# Patient Record
Sex: Male | Born: 1955 | Race: White | Hispanic: No | Marital: Married | Smoking: Current every day smoker
Health system: Southern US, Community
[De-identification: ages and names within clinical notes are randomized; demographics above are authoritative.]

## PROBLEM LIST (undated history)

## (undated) DIAGNOSIS — E7800 Pure hypercholesterolemia, unspecified: Secondary | ICD-10-CM

## (undated) DIAGNOSIS — I10 Essential (primary) hypertension: Secondary | ICD-10-CM

## (undated) DIAGNOSIS — N400 Enlarged prostate without lower urinary tract symptoms: Secondary | ICD-10-CM

## (undated) DIAGNOSIS — M1990 Unspecified osteoarthritis, unspecified site: Secondary | ICD-10-CM

## (undated) HISTORY — PX: COLONOSCOPY: SHX174

## (undated) HISTORY — PX: TONSILLECTOMY: SUR1361

## (undated) HISTORY — PX: TOE SURGERY: SHX1073

## (undated) HISTORY — PX: PILONIDAL CYST EXCISION: SHX744

---

## 2004-01-16 ENCOUNTER — Ambulatory Visit: Payer: Self-pay | Admitting: Gastroenterology

## 2006-02-07 ENCOUNTER — Emergency Department: Payer: Self-pay | Admitting: Emergency Medicine

## 2007-10-19 ENCOUNTER — Ambulatory Visit: Payer: Self-pay | Admitting: Orthopedic Surgery

## 2007-12-03 ENCOUNTER — Ambulatory Visit: Payer: Self-pay | Admitting: Orthopedic Surgery

## 2007-12-10 ENCOUNTER — Ambulatory Visit: Payer: Self-pay | Admitting: Orthopedic Surgery

## 2007-12-10 HISTORY — PX: ROTATOR CUFF REPAIR: SHX139

## 2009-05-15 ENCOUNTER — Ambulatory Visit: Payer: Self-pay | Admitting: General Surgery

## 2009-12-09 ENCOUNTER — Ambulatory Visit: Payer: Self-pay | Admitting: Orthopedic Surgery

## 2011-05-16 ENCOUNTER — Ambulatory Visit: Payer: Self-pay | Admitting: Anesthesiology

## 2011-05-16 LAB — BASIC METABOLIC PANEL
Calcium, Total: 8.9 mg/dL (ref 8.5–10.1)
Co2: 27 mmol/L (ref 21–32)
Creatinine: 1.07 mg/dL (ref 0.60–1.30)
EGFR (African American): >60
EGFR (Non-African Amer.): >60
Potassium: 3.7 mmol/L (ref 3.5–5.1)

## 2011-05-20 ENCOUNTER — Ambulatory Visit: Payer: Self-pay | Admitting: General Surgery

## 2013-04-28 ENCOUNTER — Emergency Department: Payer: Self-pay | Admitting: Emergency Medicine

## 2014-03-24 ENCOUNTER — Ambulatory Visit: Payer: Self-pay | Admitting: Gastroenterology

## 2014-05-11 NOTE — Op Note (Signed)
PATIENT NAME:  Tim Cox, Tim Cox MR#:  [id number] DATE OF BIRTH:  1955-02-05  DATE OF PROCEDURE:  05/20/2011  PREOPERATIVE DIAGNOSIS: Recurrent pilonidal cyst.   POSTOPERATIVE DIAGNOSIS: Recurrent pilonidal cyst.   OPERATIVE PROCEDURE: Excision of pilonidal cyst.   OPERATING SURGEON: Earline MayotteJeffrey W. Lynnett Langlinais, MD   ANESTHESIA: General endotracheal under Dr. Henrene HawkingKephart, Marcaine 0.5% with 1:200,000 units epinephrine, 30 mL local infiltration.   ESTIMATED BLOOD LOSS: Minimal.   CLINICAL NOTE: This 59 year old male had previously undergone a pilonidal cyst excision in the distant past. He recently fell and landed on the coccyx. He has had persistent pain and swelling in this area and examination suggested disruption of the distal end of his incision and possible recurrent pilonidal cyst formation. He is taken to the operating room for planned excision and possible rotation flap coverage.   OPERATIVE NOTE: With the patient under adequate general endotracheal anesthesia, he was rolled to the prone position and adequately padded. The area was prepped with Betadine solution and draped. Marcaine was infiltrated for postoperative analgesia. The most inferior aspect of his previous incision was less inflamed than on his last office exam. Through an elliptical incision this was excised and it was undermined approximately a centimeter and a half towards the anus. Hemostasis was excellent. There was no purulent material noted. There was some chronic scar tissue evident. The wound was irrigated with saline and then closed in multiple layers with 2-0 Vicryl figure-of-eight sutures. The skin was closed with a running 3-0 Vicryl subcuticular suture. Benzoin and Steri-Strips followed by Telfa and Tegaderm dressing was applied. The patient tolerated the procedure well and was taken to the recovery room stable condition.  ____________________________ Earline MayotteJeffrey W. Termaine Roupp, MD jwb:drc D: 05/20/2011 13:05:42 ET T: 05/20/2011  13:25:27 ET JOB#: [id number] cc: Earline MayotteJeffrey W. Elige Shouse, MD, <Dictator>, Marisue IvanKanhka Linthavong, MD Tida Saner Brion AlimentW Piper Hassebrock MD ELECTRONICALLY SIGNED 05/23/2011 9:56

## 2015-05-05 ENCOUNTER — Other Ambulatory Visit: Payer: Self-pay | Admitting: Student

## 2015-05-05 DIAGNOSIS — M7581 Other shoulder lesions, right shoulder: Secondary | ICD-10-CM

## 2015-05-26 ENCOUNTER — Ambulatory Visit
Admission: RE | Admit: 2015-05-26 | Discharge: 2015-05-26 | Disposition: A | Discharge status: Home / Self Care | Payer: 59 | Source: Ambulatory Visit | Attending: Student | Admitting: Student

## 2015-05-26 DIAGNOSIS — S46811A Strain of other muscles, fascia and tendons at shoulder and upper arm level, right arm, initial encounter: Secondary | ICD-10-CM | POA: Insufficient documentation

## 2015-05-26 DIAGNOSIS — M7581 Other shoulder lesions, right shoulder: Secondary | ICD-10-CM | POA: Insufficient documentation

## 2015-05-26 DIAGNOSIS — M25411 Effusion, right shoulder: Secondary | ICD-10-CM | POA: Diagnosis not present

## 2015-05-26 DIAGNOSIS — M19011 Primary osteoarthritis, right shoulder: Secondary | ICD-10-CM | POA: Diagnosis not present

## 2015-10-29 ENCOUNTER — Encounter: Payer: Self-pay | Admitting: *Deleted

## 2015-11-04 ENCOUNTER — Ambulatory Visit: Payer: 59 | Admitting: Anesthesiology

## 2015-11-04 ENCOUNTER — Ambulatory Visit
Admission: RE | Admit: 2015-11-04 | Discharge: 2015-11-04 | Disposition: A | Discharge status: Home / Self Care | Payer: 59 | Source: Ambulatory Visit | Attending: Surgery | Admitting: Surgery

## 2015-11-04 ENCOUNTER — Encounter
Admission: RE | Disposition: A | Discharge status: Home / Self Care | Payer: Self-pay | Source: Ambulatory Visit | Attending: Surgery

## 2015-11-04 DIAGNOSIS — N400 Enlarged prostate without lower urinary tract symptoms: Secondary | ICD-10-CM | POA: Insufficient documentation

## 2015-11-04 DIAGNOSIS — M7521 Bicipital tendinitis, right shoulder: Secondary | ICD-10-CM | POA: Diagnosis not present

## 2015-11-04 DIAGNOSIS — E785 Hyperlipidemia, unspecified: Secondary | ICD-10-CM | POA: Diagnosis not present

## 2015-11-04 DIAGNOSIS — Z7982 Long term (current) use of aspirin: Secondary | ICD-10-CM | POA: Diagnosis not present

## 2015-11-04 DIAGNOSIS — M75111 Incomplete rotator cuff tear or rupture of right shoulder, not specified as traumatic: Secondary | ICD-10-CM | POA: Insufficient documentation

## 2015-11-04 DIAGNOSIS — M19011 Primary osteoarthritis, right shoulder: Secondary | ICD-10-CM | POA: Insufficient documentation

## 2015-11-04 DIAGNOSIS — M65811 Other synovitis and tenosynovitis, right shoulder: Secondary | ICD-10-CM | POA: Insufficient documentation

## 2015-11-04 DIAGNOSIS — I10 Essential (primary) hypertension: Secondary | ICD-10-CM | POA: Diagnosis not present

## 2015-11-04 DIAGNOSIS — F17210 Nicotine dependence, cigarettes, uncomplicated: Secondary | ICD-10-CM | POA: Insufficient documentation

## 2015-11-04 DIAGNOSIS — M94211 Chondromalacia, right shoulder: Secondary | ICD-10-CM | POA: Diagnosis not present

## 2015-11-04 DIAGNOSIS — M7541 Impingement syndrome of right shoulder: Secondary | ICD-10-CM | POA: Insufficient documentation

## 2015-11-04 HISTORY — DX: Essential (primary) hypertension: I10

## 2015-11-04 HISTORY — PX: SHOULDER ARTHROSCOPY: SHX128

## 2015-11-04 HISTORY — DX: Unspecified osteoarthritis, unspecified site: M19.90

## 2015-11-04 HISTORY — PX: BICEPT TENODESIS: SHX5116

## 2015-11-04 HISTORY — DX: Pure hypercholesterolemia, unspecified: E78.00

## 2015-11-04 SURGERY — ARTHROSCOPY, SHOULDER
Anesthesia: Regional | Laterality: Right | Wound class: Clean

## 2015-11-04 MED ORDER — METOCLOPRAMIDE HCL 5 MG PO TABS
5.0000 mg | ORAL_TABLET | Freq: Three times a day (TID) | ORAL | Status: DC | PRN
Start: 2015-11-04 — End: 2015-11-04

## 2015-11-04 MED ORDER — DEXAMETHASONE SODIUM PHOSPHATE 4 MG/ML IJ SOLN
INTRAMUSCULAR | Status: DC | PRN
  Administered 2015-11-04: 4 mg via PERINEURAL
  Administered 2015-11-04: 4 mg via INTRAVENOUS

## 2015-11-04 MED ORDER — PROPOFOL 10 MG/ML IV BOLUS
INTRAVENOUS | Status: DC | PRN
  Administered 2015-11-04: 20 mg via INTRAVENOUS
  Administered 2015-11-04: 150 mg via INTRAVENOUS

## 2015-11-04 MED ORDER — BUPIVACAINE-EPINEPHRINE (PF) 0.5% -1:200000 IJ SOLN
INTRAMUSCULAR | Status: DC | PRN
  Administered 2015-11-04: 20 mL via PERINEURAL

## 2015-11-04 MED ORDER — ONDANSETRON HCL 4 MG/2ML IJ SOLN: 4.0000 mg | Freq: Four times a day (QID) | INTRAMUSCULAR | Status: DC | PRN

## 2015-11-04 MED ORDER — ROPIVACAINE HCL 5 MG/ML IJ SOLN
INTRAMUSCULAR | Status: DC | PRN
  Administered 2015-11-04: 35 mL via PERINEURAL

## 2015-11-04 MED ORDER — ACETAMINOPHEN 160 MG/5ML PO SOLN: 325.0000 mg | ORAL | Status: DC | PRN

## 2015-11-04 MED ORDER — OXYCODONE HCL 5 MG PO TABS: 5.0000 mg | ORAL_TABLET | ORAL | 0 refills | Status: DC | PRN

## 2015-11-04 MED ORDER — METOCLOPRAMIDE HCL 5 MG/ML IJ SOLN: 5.0000 mg | Freq: Three times a day (TID) | INTRAMUSCULAR | Status: DC | PRN

## 2015-11-04 MED ORDER — FENTANYL CITRATE (PF) 100 MCG/2ML IJ SOLN
INTRAMUSCULAR | Status: DC | PRN
  Administered 2015-11-04: 50 ug via INTRAVENOUS

## 2015-11-04 MED ORDER — OXYCODONE HCL 5 MG PO TABS: 5.0000 mg | ORAL_TABLET | ORAL | Status: DC | PRN

## 2015-11-04 MED ORDER — LIDOCAINE HCL (CARDIAC) 20 MG/ML IV SOLN
INTRAVENOUS | Status: DC | PRN
  Administered 2015-11-04: 40 mg via INTRATRACHEAL

## 2015-11-04 MED ORDER — LACTATED RINGERS IV SOLN
INTRAVENOUS | Status: DC
  Administered 2015-11-04 (×2): via INTRAVENOUS

## 2015-11-04 MED ORDER — GLYCOPYRROLATE 0.2 MG/ML IJ SOLN
INTRAMUSCULAR | Status: DC | PRN
  Administered 2015-11-04: 0.1 mg via INTRAVENOUS

## 2015-11-04 MED ORDER — OXYCODONE HCL 5 MG PO TABS: 5.0000 mg | ORAL_TABLET | Freq: Once | ORAL | Status: DC | PRN

## 2015-11-04 MED ORDER — MIDAZOLAM HCL 2 MG/2ML IJ SOLN
INTRAMUSCULAR | Status: DC | PRN
  Administered 2015-11-04 (×2): 2 mg via INTRAVENOUS

## 2015-11-04 MED ORDER — HYDROMORPHONE HCL 1 MG/ML IJ SOLN: 0.2500 mg | INTRAMUSCULAR | Status: DC | PRN

## 2015-11-04 MED ORDER — ONDANSETRON HCL 4 MG/2ML IJ SOLN: 4.0000 mg | Freq: Once | INTRAMUSCULAR | Status: DC | PRN

## 2015-11-04 MED ORDER — EPHEDRINE SULFATE 50 MG/ML IJ SOLN
INTRAMUSCULAR | Status: DC | PRN
  Administered 2015-11-04 (×7): 5 mg via INTRAVENOUS

## 2015-11-04 MED ORDER — OXYCODONE HCL 5 MG/5ML PO SOLN: 5.0000 mg | Freq: Once | ORAL | Status: DC | PRN

## 2015-11-04 MED ORDER — DEXTROSE 5 % IV SOLN
2000.0000 mg | Freq: Once | INTRAVENOUS | Status: AC
  Administered 2015-11-04: 2000 mg via INTRAVENOUS

## 2015-11-04 MED ORDER — ONDANSETRON HCL 4 MG/2ML IJ SOLN
INTRAMUSCULAR | Status: DC | PRN
  Administered 2015-11-04: 4 mg via INTRAVENOUS

## 2015-11-04 MED ORDER — ACETAMINOPHEN 325 MG PO TABS: 325.0000 mg | ORAL_TABLET | ORAL | Status: DC | PRN

## 2015-11-04 MED ORDER — ONDANSETRON HCL 4 MG PO TABS: 4.0000 mg | ORAL_TABLET | Freq: Four times a day (QID) | ORAL | Status: DC | PRN

## 2015-11-04 SURGICAL SUPPLY — 37 items
ANCHOR JUGGERKNOT WTAP NDL 2.9 (Anchor) ×12 IMPLANT
ANCHOR SUT QUATTRO KNTLS 4.5 (Anchor) ×8 IMPLANT
BIT DRILL JUGRKNT W/NDL BIT2.9 (DRILL) ×2 IMPLANT
BLADE FULL RADIUS 3.5 (BLADE) ×4 IMPLANT
BUR ACROMIONIZER 4.0 (BURR) ×4 IMPLANT
CANNULA SHAVER 8MMX76MM (CANNULA) ×4 IMPLANT
CHLORAPREP W/TINT 26ML (MISCELLANEOUS) ×8 IMPLANT
COVER LIGHT HANDLE UNIVERSAL (MISCELLANEOUS) ×8 IMPLANT
COVER MAYO STAND STRL (DRAPES) ×4 IMPLANT
DRAPE IMP U-DRAPE 54X76 (DRAPES) ×8 IMPLANT
DRILL JUGGERKNOT W/NDL BIT 2.9 (DRILL) ×4
GAUZE PETRO XEROFOAM 1X8 (MISCELLANEOUS) ×4 IMPLANT
GAUZE SPONGE 4X4 12PLY STRL (GAUZE/BANDAGES/DRESSINGS) ×4 IMPLANT
GLOVE BIO SURGEON STRL SZ8 (GLOVE) ×8 IMPLANT
GLOVE INDICATOR 8.0 STRL GRN (GLOVE) ×4 IMPLANT
GOWN STRL REUS W/ TWL LRG LVL3 (GOWN DISPOSABLE) ×2 IMPLANT
GOWN STRL REUS W/ TWL XL LVL3 (GOWN DISPOSABLE) ×2 IMPLANT
GOWN STRL REUS W/TWL LRG LVL3 (GOWN DISPOSABLE) ×2
GOWN STRL REUS W/TWL XL LVL3 (GOWN DISPOSABLE) ×2
IV LACTATED RINGER IRRG 3000ML (IV SOLUTION) ×4
IV LR IRRIG 3000ML ARTHROMATIC (IV SOLUTION) ×4 IMPLANT
MANIFOLD 4PT FOR NEPTUNE1 (MISCELLANEOUS) ×4 IMPLANT
MAT BLUE FLOOR 46X72 FLO (MISCELLANEOUS) ×4 IMPLANT
NEEDLE HYPO 21X1.5 SAFETY (NEEDLE) ×4 IMPLANT
NEEDLE REVERSE CUT 1/2 CRC (NEEDLE) IMPLANT
PACK ARTHROSCOPY SHOULDER (MISCELLANEOUS) ×4 IMPLANT
PAD GROUND ADULT SPLIT (MISCELLANEOUS) ×4 IMPLANT
STAPLER SKIN PROX 35W (STAPLE) ×4 IMPLANT
STRAP BODY AND KNEE 60X3 (MISCELLANEOUS) ×8 IMPLANT
SUT ETHIBOND 0 MO6 C/R (SUTURE) ×4 IMPLANT
SUT VIC AB 2-0 CT1 27 (SUTURE)
SUT VIC AB 2-0 CT1 TAPERPNT 27 (SUTURE) IMPLANT
TAPE MICROFOAM 4IN (TAPE) ×4 IMPLANT
TUBING ARTHRO INFLOW-ONLY STRL (TUBING) ×4 IMPLANT
TUBING CONNECTING 10 (TUBING) ×3 IMPLANT
TUBING CONNECTING 10' (TUBING) ×1
WAND HAND CNTRL MULTIVAC 90 (MISCELLANEOUS) ×4 IMPLANT

## 2015-11-04 NOTE — Anesthesia Procedure Notes (Signed)
Anesthesia Regional Block:  Interscalene brachial plexus block  Pre-Anesthetic Checklist: ,, timeout performed, Correct Patient, Correct Site, Correct Laterality, Correct Procedure, Correct Position, site marked, Risks and benefits discussed,  Surgical consent,  Pre-op evaluation,  At surgeon's request and post-op pain management   Prep: chloraprep       Needles:  Injection technique: Single-shot  Needle Type: Stimiplex     Needle Length: 10cm 10 cm Needle Gauge: 21 and 21 G    Additional Needles:  Procedures: ultrasound guided (picture in chart) Interscalene brachial plexus block Narrative:  Start time: 11/04/2015 12:46 PM End time: 11/04/2015 12:55 PM Injection made incrementally with aspirations every 5 mL.  Performed by: Personally  Anesthesiologist: Starling Jessie  Additional Notes: Functioning IV was confirmed and monitors applied. Ultrasound guidance: relevant anatomy identified, needle position confirmed, local anesthetic spread visualized around nerve(s)., vascular puncture avoided.  Image printed for medical record.  Negative aspiration and no paresthesias; incremental administration of local anesthetic. The patient tolerated the procedure well. Vitals signes recorded in RN notes.

## 2015-11-04 NOTE — Op Note (Signed)
11/04/2015  3:53 PM  Patient:   Tim Tetterton Edmonston Jr.  Pre-Op Diagnosis:   Impingement/tendinopathy with near full-thickness rotator cuff tear, right shoulder.  Postoperative diagnosis: Impingement/tendinopathy with near full-thickness rotator cuff tear, extensive degenerative labral fraying, rotator degenerative joint disease, and biceps tendinopathy, right shoulder.  Procedure: Extensive arthroscopic debridement, arthroscopic subacromial decompression, mini-open rotator cuff repair, and mini-open biceps tenodesis, right shoulder.  Anesthesia: General LMA with interscalene block placed preoperatively by the anesthesiologist.  Surgeon:   Maryagnes Amos, MD  Assistant:   None  Findings: As above. 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 There was a near full-thickness tear involving the mid insertional fibers of the supraspinatus measuring approximately 2 cm in the anteroposterior dimension by 1 cm. There also were moderate tendinopathic changes of the biceps tendon.  Complications: None  Fluids:   1300 cc  Estimated blood loss: 5 cc  Tourniquet time: None  Drains: None  Closure: Staples   Brief clinical note: The patient isand a 60 year old male with a history of right shoulder pain. The patient's symptoms have progressed despite medications, activity modification, etc. The patient's history and examination are consistent with impingement/tendinopathy with a probable rotator cuff tear. These findings were confirmed by MRI scan. The patient presents at this time for definitive management of these shoulder symptoms.  Procedure: The patient underwent placement of an interscalene block by the anesthesiologist in the preoperative holding area before he was brought into the operating room and Tim in the supine  position. The patient then underwent gelaryngeal mask anesthesia before being repositioned in the beach chair position using the beach chair positioner. ThWright shoulder and upper extremity were prepped with ChloraPrep solution before being draped sterilely. Preoperative antibiotics were administered. A timeout was performed to confirm the proper surgical site before the expected portal sites and incision site were injected with 0.5% Sensorcaine with epinephrine. A posterior portal was created and the glenohumeral joint thoroughly inspected with the findings as described above. An anterior portal was created using an outside-in technique. The labrum and rotator cuff were further probed, again confirming the above-noted findings. The areas of extensive labral fraying were debrided back to stable margins using the full radius resector. The labrum itself appeared to be intact and had remained attached to the superior portion of the glenoid. Areas of synovitis, as well as areas of loose articular cartilage also were debrided back to stable margins using a full-radius resector. The ArthroCare wand was inserted and used to release the biceps tendon from its labral attachment. In addition, it was used to obtain hemostasis as well as to "anneal" the labrum superiorly and anteriorly. The instruments were removed from the joint after suctioning the excess fluid.  The camera was repositioned through the posterior portal into the subacromial space. A separate lateral portal was created using an outside-in technique. The 3.5 mm full-radius resector was introduced and used to perform a subtotal bursectomy. The ArthroCare wand was then inserted and used to remove the periosteal tissue off the undersurface of the anterior third of the acromion as well as to recess the coracoacromial ligament from its attachment along the anterior and lateral margins of the acromion. The 4.0 mm acromionizing bur was introduced and used to  complete the decompression by removing the undersurface of the anterior third of the acromion. The full radius resector was reintroduced to remove any residual  bony debris before the ArthroCare wand was reintroduced to obtain hemostasis. The instruments were then removed from the subacromial space after suctioning the excess fluid.  An approximately 4-5 cm incision was made over the anterolateral aspect of the shoulder beginning at the anterolateral corner of the acromion and extending distally in line with the bicipital groove. This incision was carried down through the subcutaneous tissues to expose the deltoid fascia. The raphae between the anterior and middle thirds was identified and this plane developed to provide access into the subacromial space. Additional bursal tissues were debrided sharply using Metzenbaum scissors. The rotator cuff tear was readily identified. The margins were debrided sharply with a #15 blade and the exposed greater tuberosity roughened with a rongeur. The tear was repaired using two Biomet 2.9 mm JuggerKnot anchors. These sutures were then brought back laterally and secured using two [place] QuatroLink anchors to create a two-layer closure. An apparent watertight closure was obtained.  The bicipital groove was identified by palpation and opened for 1-1.5 cm. The biceps tendon stump was retrieved through this defect. The floor of the bicipital groove was roughened with a curet before another Biomet 2.9 mm JuggerKnot anchor was inserted. Both sets of sutures were passed through the biceps tendon and tied securely to effect the tenodesis. The bicipital sheath was reapproximated using two #0 Ethibond interrupted sutures, incorporating the biceps tendon to further reinforce the tenodesis.  The wound was copiously irrigated with sterile saline solution before the deltoid raphae was reapproximated using 2-0 Vicryl interrupted sutures. The subcutaneous tissues were closed in two layers  using 2-0 Vicryl interrupted sutures before the skin was closed using staples. The portal sites also were closed using staples. A sterile bulky dressing was applied to the shoulder before the arm was placed into a shoulder immobilizer. The patient was then awakened, extubated, and returned to the recovery room in satisfactory condition after tolerating the procedure well.

## 2015-11-04 NOTE — Anesthesia Preprocedure Evaluation (Addendum)
Anesthesia Evaluation  Patient identified by MRN, date of birth, ID band Patient awake    Reviewed: Allergy & Precautions, H&P , NPO status , Patient's Chart, lab work & pertinent test results, reviewed documented beta blocker date and time   Airway Mallampati: II  TM Distance: >3 FB Neck ROM: full    Dental no notable dental hx.    Pulmonary Current Smoker,    Pulmonary exam normal breath sounds clear to auscultation       Cardiovascular Exercise Tolerance: Good hypertension,  Rhythm:regular Rate:Normal     Neuro/Psych negative neurological ROS  negative psych ROS   GI/Hepatic negative GI ROS, Neg liver ROS,   Endo/Other  negative endocrine ROS  Renal/GU negative Renal ROS  negative genitourinary   Musculoskeletal   Abdominal   Peds  Hematology negative hematology ROS (+)   Anesthesia Other Findings   Reproductive/Obstetrics negative OB ROS                             Anesthesia Physical Anesthesia Plan  ASA: II  Anesthesia Plan: General LMA and Regional   Post-op Pain Management:  Regional for Post-op pain   Induction:   Airway Management Planned:   Additional Equipment:   Intra-op Plan:   Post-operative Plan:   Informed Consent: I have reviewed the patients History and Physical, chart, labs and discussed the procedure including the risks, benefits and alternatives for the proposed anesthesia with the patient or authorized representative who has indicated his/her understanding and acceptance.   Dental Advisory Given  Plan Discussed with: CRNA and Anesthesiologist  Anesthesia Plan Comments:         Anesthesia Quick Evaluation

## 2015-11-04 NOTE — Transfer of Care (Signed)
Immediate Anesthesia Transfer of Care Note  Patient: Tim SaugerWilliam Kenneth Verdone Jr.  Procedure(s) Performed: Procedure(s): ARTHROSCOPY SHOULDER WITH DEBRIDEMENT DECOMPRESSION AND REPAIR OF THE ROTATOR CUFF TEAR (Right)  Patient Location: PACU  Anesthesia Type: General LMA, Regional  Level of Consciousness: awake, alert  and patient cooperative  Airway and Oxygen Therapy: Patient Spontanous Breathing and Patient connected to supplemental oxygen  Post-op Assessment: Post-op Vital signs reviewed, Patient's Cardiovascular Status Stable, Respiratory Function Stable, Patent Airway and No signs of Nausea or vomiting  Post-op Vital Signs: Reviewed and stable  Complications: No apparent anesthesia complications

## 2015-11-04 NOTE — Progress Notes (Signed)
Assisted Baxter Flatteryavid Bacon ANMD with right, ultrasound guided, supraclavicular block. Side rails up, monitors on throughout procedure. See vital signs in flow sheet. Tolerated Procedure well.

## 2015-11-04 NOTE — Discharge Instructions (Signed)
General Anesthesia, Adult, Care After Refer to this sheet in the next few weeks. These instructions provide you with information on caring for yourself after your procedure. Your health care provider may also give you more specific instructions. Your treatment has been planned according to current medical practices, but problems sometimes occur. Call your health care provider if you have any problems or questions after your procedure. WHAT TO EXPECT AFTER THE PROCEDURE After the procedure, it is typical to experience:  Sleepiness.  Nausea and vomiting. HOME CARE INSTRUCTIONS  For the first 24 hours after general anesthesia:  Have a responsible person with you.  Do not drive a car. If you are alone, do not take public transportation.  Do not drink alcohol.  Do not take medicine that has not been prescribed by your health care provider.  Do not sign important papers or make important decisions.  You may resume a normal diet and activities as directed by your health care provider.  Change bandages (dressings) as directed.  If you have questions or problems that seem related to general anesthesia, call the hospital and ask for the anesthetist or anesthesiologist on call. SEEK MEDICAL CARE IF:  You have nausea and vomiting that continue the day after anesthesia.  You develop a rash. SEEK IMMEDIATE MEDICAL CARE IF:   You have difficulty breathing.  You have chest pain.  You have any allergic problems.   This information is not intended to replace advice given to you by your health care provider. Make sure you discuss any questions you have with your health care provider.   Document Released: 04/11/2000 Document Revised: 01/24/2014 Document Reviewed: 05/04/2011 Elsevier Interactive Patient Education 2016 [employer]  Keep dressing dry and intact.  May shower after dressing changed on post-op day #4 (Sunday).  Cover staples with Band-Aids after drying off. Apply ice  frequently to shoulder. Take Aleve 2 tablets twice daily with meals for 7-10 days, then as necessary. Take oxycodone as prescribed when needed.  May supplement with ES Tylenol if necessary. Keep shoulder immobilizer on at all times except may remove for bathing purposes. Follow-up in 10-14 days or as scheduled.

## 2015-11-04 NOTE — H&P (Signed)
Paper H&P to be scanned into permanent record. H&P reviewed. No changes. 

## 2015-11-04 NOTE — Anesthesia Procedure Notes (Signed)
Procedure Name: LMA Insertion Date/Time: 11/04/2015 2:18 PM Performed by: Jimmy PicketAMYOT, Sequoyah Counterman Pre-anesthesia Checklist: Patient identified, Emergency Drugs available, Suction available, Timeout performed and Patient being monitored Patient Re-evaluated:Patient Re-evaluated prior to inductionOxygen Delivery Method: Circle system utilized Preoxygenation: Pre-oxygenation with 100% oxygen Intubation Type: IV induction LMA: LMA inserted LMA Size: 4.0 Number of attempts: 1 Placement Confirmation: positive ETCO2 and breath sounds checked- equal and bilateral Tube secured with: Tape

## 2015-11-04 NOTE — Anesthesia Postprocedure Evaluation (Signed)
Anesthesia Post Note  Patient: Tim SaugerWilliam Kenneth Latouche Jr.  Procedure(s) Performed: Procedure(s) (LRB): ARTHROSCOPY SHOULDER WITH DEBRIDEMENT DECOMPRESSION AND REPAIR OF THE ROTATOR CUFF TEAR (Right) BICEPS TENODESIS  Patient location during evaluation: PACU Anesthesia Type: General Level of consciousness: awake and alert Pain management: pain level controlled Vital Signs Assessment: post-procedure vital signs reviewed and stable Respiratory status: spontaneous breathing, nonlabored ventilation, respiratory function stable and patient connected to nasal cannula oxygen Cardiovascular status: blood pressure returned to baseline and stable Postop Assessment: no signs of nausea or vomiting Anesthetic complications: no    Alta CorningBacon, Waleed Dettman S

## 2015-11-05 ENCOUNTER — Encounter: Payer: Self-pay | Admitting: Surgery

## 2020-03-16 ENCOUNTER — Other Ambulatory Visit: Payer: Self-pay | Admitting: Surgery

## 2020-03-16 DIAGNOSIS — M19012 Primary osteoarthritis, left shoulder: Secondary | ICD-10-CM

## 2020-03-16 DIAGNOSIS — M7582 Other shoulder lesions, left shoulder: Secondary | ICD-10-CM

## 2020-03-16 DIAGNOSIS — M25512 Pain in left shoulder: Secondary | ICD-10-CM

## 2020-03-16 DIAGNOSIS — M75122 Complete rotator cuff tear or rupture of left shoulder, not specified as traumatic: Secondary | ICD-10-CM

## 2020-03-27 ENCOUNTER — Ambulatory Visit
Admission: RE | Admit: 2020-03-27 | Discharge: 2020-03-27 | Disposition: A | Discharge status: Home / Self Care | Payer: BC Managed Care – PPO | Source: Ambulatory Visit | Attending: Surgery | Admitting: Surgery

## 2020-03-27 ENCOUNTER — Other Ambulatory Visit: Payer: Self-pay

## 2020-03-27 DIAGNOSIS — M75122 Complete rotator cuff tear or rupture of left shoulder, not specified as traumatic: Secondary | ICD-10-CM | POA: Insufficient documentation

## 2020-03-27 DIAGNOSIS — M25512 Pain in left shoulder: Secondary | ICD-10-CM | POA: Insufficient documentation

## 2020-03-27 DIAGNOSIS — M7582 Other shoulder lesions, left shoulder: Secondary | ICD-10-CM | POA: Insufficient documentation

## 2020-03-27 DIAGNOSIS — M19012 Primary osteoarthritis, left shoulder: Secondary | ICD-10-CM | POA: Insufficient documentation

## 2020-04-30 ENCOUNTER — Other Ambulatory Visit: Payer: Self-pay | Admitting: Surgery

## 2020-05-14 ENCOUNTER — Other Ambulatory Visit
Admission: RE | Admit: 2020-05-14 | Discharge: 2020-05-14 | Disposition: A | Discharge status: Home / Self Care | Payer: BC Managed Care – PPO | Source: Ambulatory Visit | Attending: Surgery | Admitting: Surgery

## 2020-05-14 ENCOUNTER — Other Ambulatory Visit: Payer: Self-pay

## 2020-05-14 ENCOUNTER — Encounter
Admission: RE | Admit: 2020-05-14 | Discharge: 2020-05-14 | Disposition: A | Discharge status: Home / Self Care | Payer: BC Managed Care – PPO | Source: Ambulatory Visit | Attending: Surgery | Admitting: Surgery

## 2020-05-14 DIAGNOSIS — I10 Essential (primary) hypertension: Secondary | ICD-10-CM | POA: Diagnosis not present

## 2020-05-14 DIAGNOSIS — Z01818 Encounter for other preprocedural examination: Secondary | ICD-10-CM | POA: Diagnosis not present

## 2020-05-14 HISTORY — DX: Benign prostatic hyperplasia without lower urinary tract symptoms: N40.0

## 2020-05-14 LAB — POTASSIUM: Potassium: 4.1 mmol/L (ref 3.5–5.1)

## 2020-05-14 NOTE — Patient Instructions (Addendum)
Your procedure is scheduled on:  Thursday, May 5 Report to the Registration Desk on the 1st floor of the [facility] To find out your arrival time, please call [phone] between 1PM - 3PM on: Wednesday, May 4  REMEMBER: Instructions that are not followed completely may result in serious medical risk, up to and including death; or upon the discretion of your surgeon and anesthesiologist your surgery may need to be rescheduled.  Do not eat food after midnight the night before surgery.  No gum chewing, lozengers or hard candies.  You may however, drink CLEAR liquids up to 2 hours before you are scheduled to arrive for your surgery. Do not drink anything within 2 hours of your scheduled arrival time.  Clear liquids include: - water  - apple juice without pulp - gatorade (not RED, PURPLE, OR BLUE) - black coffee or tea (Do NOT add milk or creamers to the coffee or tea) Do NOT drink anything that is not on this list.  In addition, your doctor has ordered for you to drink the provided  Ensure Pre-Surgery Clear Carbohydrate Drink  Drinking this carbohydrate drink up to two hours before surgery helps to reduce insulin resistance and improve patient outcomes. Please complete drinking 2 hours prior to scheduled arrival time.  TAKE THESE MEDICATIONS THE MORNING OF SURGERY WITH A SIP OF WATER:  1.  tamsulosin (Flomax)  One week prior to surgery: starting today, April 28 Stop Anti-inflammatories (NSAIDS) such as Advil, Aleve, Ibuprofen, Motrin, Naproxen, Naprosyn and Aspirin based products such as Excedrin, Goodys Powder, BC Powder. Stop ANY OVER THE COUNTER supplements until after surgery.  No Alcohol for 24 hours before or after surgery.  No Smoking including e-cigarettes for 24 hours prior to surgery.  No chewable tobacco products for at least 6 hours prior to surgery.  No nicotine patches on the day of surgery.  Do not use any "recreational" drugs for at least a week prior to your  surgery.  Please be advised that the combination of cocaine and anesthesia may have negative outcomes, up to and including death. If you test positive for cocaine, your surgery will be cancelled.  On the morning of surgery brush your teeth with toothpaste and water, you may rinse your mouth with mouthwash if you wish. Do not swallow any toothpaste or mouthwash.  Do not wear jewelry, make-up, hairpins, clips or nail polish.  Do not wear lotions, powders, or perfumes.   Do not shave body from the neck down 48 hours prior to surgery just in case you cut yourself which could leave a site for infection.  Also, freshly shaved skin may become irritated if using the CHG soap.  Contact lenses, hearing aids and dentures may not be worn into surgery.  Do not bring valuables to the hospital. [facility] is not responsible for any missing/lost belongings or valuables.   Use CHG Soap as directed on instruction sheet.  Notify your doctor if there is any change in your medical condition (cold, fever, infection).  Wear comfortable clothing (specific to your surgery type) to the hospital.  Plan for stool softeners for home use; pain medications have a tendency to cause constipation. You can also help prevent constipation by eating foods high in fiber such as fruits and vegetables and drinking plenty of fluids as your diet allows.  After surgery, you can help prevent lung complications by doing breathing exercises.  Take deep breaths and cough every 1-2 hours. Your doctor may order a device  called an Facilities manager to help you take deep breaths.  If you are being discharged the day of surgery, you will not be allowed to drive home. You will need a responsible adult (18 years or older) to drive you home and stay with you that night.   If you are taking public transportation, you will need to have a responsible adult (18 years or older) with you. Please confirm with your physician that it is  acceptable to use public transportation.   Please call the Pre-admissions Testing Dept. at [phone] if you have any questions about these instructions.  Surgery Visitation Policy:  Patients undergoing a surgery or procedure may have one family member or support person with them as long as that person is not COVID-19 positive or experiencing its symptoms.  That person may remain in the waiting area during the procedure.

## 2020-05-21 ENCOUNTER — Encounter
Admission: RE | Disposition: A | Discharge status: Home / Self Care | Payer: Self-pay | Source: Home / Self Care | Attending: Surgery

## 2020-05-21 ENCOUNTER — Encounter: Payer: Self-pay | Admitting: Surgery

## 2020-05-21 ENCOUNTER — Other Ambulatory Visit: Payer: Self-pay

## 2020-05-21 ENCOUNTER — Ambulatory Visit: Payer: BC Managed Care – PPO

## 2020-05-21 ENCOUNTER — Ambulatory Visit
Admission: RE | Admit: 2020-05-21 | Discharge: 2020-05-21 | Disposition: A | Discharge status: Home / Self Care | Payer: BC Managed Care – PPO | Attending: Surgery | Admitting: Surgery

## 2020-05-21 DIAGNOSIS — M7522 Bicipital tendinitis, left shoulder: Secondary | ICD-10-CM | POA: Diagnosis not present

## 2020-05-21 DIAGNOSIS — M19012 Primary osteoarthritis, left shoulder: Secondary | ICD-10-CM | POA: Diagnosis not present

## 2020-05-21 DIAGNOSIS — Z7982 Long term (current) use of aspirin: Secondary | ICD-10-CM | POA: Diagnosis not present

## 2020-05-21 DIAGNOSIS — M25812 Other specified joint disorders, left shoulder: Secondary | ICD-10-CM | POA: Diagnosis present

## 2020-05-21 DIAGNOSIS — F17210 Nicotine dependence, cigarettes, uncomplicated: Secondary | ICD-10-CM | POA: Diagnosis not present

## 2020-05-21 DIAGNOSIS — S43432A Superior glenoid labrum lesion of left shoulder, initial encounter: Secondary | ICD-10-CM | POA: Diagnosis not present

## 2020-05-21 DIAGNOSIS — Z79899 Other long term (current) drug therapy: Secondary | ICD-10-CM | POA: Insufficient documentation

## 2020-05-21 DIAGNOSIS — X58XXXA Exposure to other specified factors, initial encounter: Secondary | ICD-10-CM | POA: Insufficient documentation

## 2020-05-21 DIAGNOSIS — Z419 Encounter for procedure for purposes other than remedying health state, unspecified: Secondary | ICD-10-CM

## 2020-05-21 DIAGNOSIS — Z9889 Other specified postprocedural states: Secondary | ICD-10-CM | POA: Insufficient documentation

## 2020-05-21 DIAGNOSIS — M75112 Incomplete rotator cuff tear or rupture of left shoulder, not specified as traumatic: Secondary | ICD-10-CM | POA: Insufficient documentation

## 2020-05-21 HISTORY — PX: SHOULDER ARTHROSCOPY: SHX128

## 2020-05-21 SURGERY — ARTHROSCOPY, SHOULDER
Anesthesia: General | Site: Shoulder | Laterality: Left

## 2020-05-21 MED ORDER — METOCLOPRAMIDE HCL 10 MG PO TABS: 5.0000 mg | ORAL_TABLET | Freq: Three times a day (TID) | ORAL | Status: DC | PRN

## 2020-05-21 MED ORDER — EPINEPHRINE PF 1 MG/ML IJ SOLN
INTRAMUSCULAR | Status: DC | PRN
  Administered 2020-05-21: 1 mg

## 2020-05-21 MED ORDER — METOCLOPRAMIDE HCL 5 MG/ML IJ SOLN: 5.0000 mg | Freq: Three times a day (TID) | INTRAMUSCULAR | Status: DC | PRN

## 2020-05-21 MED ORDER — DEXAMETHASONE SODIUM PHOSPHATE 10 MG/ML IJ SOLN
INTRAMUSCULAR | Status: AC
  Filled 2020-05-21: qty 1

## 2020-05-21 MED ORDER — KETOROLAC TROMETHAMINE 30 MG/ML IJ SOLN: 30.0000 mg | Freq: Once | INTRAMUSCULAR | Status: DC

## 2020-05-21 MED ORDER — ACETAMINOPHEN 10 MG/ML IV SOLN
INTRAVENOUS | Status: AC
  Filled 2020-05-21: qty 100

## 2020-05-21 MED ORDER — CEFAZOLIN SODIUM-DEXTROSE 2-4 GM/100ML-% IV SOLN
2.0000 g | INTRAVENOUS | Status: AC
  Administered 2020-05-21: 2 g via INTRAVENOUS

## 2020-05-21 MED ORDER — BUPIVACAINE-EPINEPHRINE (PF) 0.5% -1:200000 IJ SOLN
INTRAMUSCULAR | Status: DC | PRN
  Administered 2020-05-21: 30 mL

## 2020-05-21 MED ORDER — FENTANYL CITRATE (PF) 100 MCG/2ML IJ SOLN
INTRAMUSCULAR | Status: DC | PRN
  Administered 2020-05-21: 50 ug via INTRAVENOUS

## 2020-05-21 MED ORDER — ONDANSETRON HCL 4 MG/2ML IJ SOLN: 4.0000 mg | Freq: Four times a day (QID) | INTRAMUSCULAR | Status: DC | PRN

## 2020-05-21 MED ORDER — MIDAZOLAM HCL 2 MG/2ML IJ SOLN: 1.0000 mg | Freq: Once | INTRAMUSCULAR | Status: AC

## 2020-05-21 MED ORDER — CEFAZOLIN SODIUM-DEXTROSE 2-4 GM/100ML-% IV SOLN
INTRAVENOUS | Status: AC
  Filled 2020-05-21: qty 100

## 2020-05-21 MED ORDER — OXYCODONE HCL 5 MG PO TABS: 5.0000 mg | ORAL_TABLET | ORAL | Status: DC | PRN

## 2020-05-21 MED ORDER — FENTANYL CITRATE (PF) 100 MCG/2ML IJ SOLN
INTRAMUSCULAR | Status: AC
  Filled 2020-05-21: qty 2

## 2020-05-21 MED ORDER — FAMOTIDINE 20 MG PO TABS
ORAL_TABLET | ORAL | Status: AC
  Administered 2020-05-21: 20 mg
  Filled 2020-05-21: qty 1

## 2020-05-21 MED ORDER — GLYCOPYRROLATE 0.2 MG/ML IJ SOLN
INTRAMUSCULAR | Status: AC
  Filled 2020-05-21: qty 1

## 2020-05-21 MED ORDER — MIDAZOLAM HCL 2 MG/2ML IJ SOLN
INTRAMUSCULAR | Status: DC | PRN
  Administered 2020-05-21: 2 mg via INTRAVENOUS

## 2020-05-21 MED ORDER — GLYCOPYRROLATE 0.2 MG/ML IJ SOLN
INTRAMUSCULAR | Status: DC | PRN
  Administered 2020-05-21: .2 mg via INTRAVENOUS

## 2020-05-21 MED ORDER — MIDAZOLAM HCL 2 MG/2ML IJ SOLN
INTRAMUSCULAR | Status: AC
  Filled 2020-05-21: qty 2

## 2020-05-21 MED ORDER — ONDANSETRON HCL 4 MG/2ML IJ SOLN
INTRAMUSCULAR | Status: DC | PRN
  Administered 2020-05-21: 4 mg via INTRAVENOUS

## 2020-05-21 MED ORDER — LACTATED RINGERS IV SOLN: INTRAVENOUS | Status: DC

## 2020-05-21 MED ORDER — ACETAMINOPHEN 10 MG/ML IV SOLN: 1000.0000 mg | Freq: Once | INTRAVENOUS | Status: DC | PRN

## 2020-05-21 MED ORDER — SUGAMMADEX SODIUM 200 MG/2ML IV SOLN
INTRAVENOUS | Status: DC | PRN
  Administered 2020-05-21: 200 mg via INTRAVENOUS

## 2020-05-21 MED ORDER — SODIUM CHLORIDE 0.9 % IV SOLN: INTRAVENOUS | Status: DC

## 2020-05-21 MED ORDER — OXYCODONE HCL 5 MG/5ML PO SOLN: 5.0000 mg | Freq: Once | ORAL | Status: DC | PRN

## 2020-05-21 MED ORDER — ONDANSETRON HCL 4 MG PO TABS: 4.0000 mg | ORAL_TABLET | Freq: Four times a day (QID) | ORAL | Status: DC | PRN

## 2020-05-21 MED ORDER — PROPOFOL 10 MG/ML IV BOLUS
INTRAVENOUS | Status: DC | PRN
  Administered 2020-05-21: 150 mg via INTRAVENOUS

## 2020-05-21 MED ORDER — OXYCODONE HCL 5 MG PO TABS
5.0000 mg | ORAL_TABLET | Freq: Once | ORAL | Status: DC | PRN
Start: 2020-05-21 — End: 2020-05-21

## 2020-05-21 MED ORDER — ORAL CARE MOUTH RINSE: 15.0000 mL | Freq: Once | OROMUCOSAL | Status: AC

## 2020-05-21 MED ORDER — PROPOFOL 10 MG/ML IV BOLUS
INTRAVENOUS | Status: AC
  Filled 2020-05-21: qty 20

## 2020-05-21 MED ORDER — LIDOCAINE HCL (CARDIAC) PF 100 MG/5ML IV SOSY
PREFILLED_SYRINGE | INTRAVENOUS | Status: DC | PRN
  Administered 2020-05-21: 60 mg via INTRAVENOUS

## 2020-05-21 MED ORDER — ROCURONIUM BROMIDE 100 MG/10ML IV SOLN
INTRAVENOUS | Status: DC | PRN
  Administered 2020-05-21: 50 mg via INTRAVENOUS

## 2020-05-21 MED ORDER — MIDAZOLAM HCL 2 MG/2ML IJ SOLN
INTRAMUSCULAR | Status: AC
  Administered 2020-05-21: 1 mg via INTRAVENOUS
  Filled 2020-05-21: qty 2

## 2020-05-21 MED ORDER — KETOROLAC TROMETHAMINE 30 MG/ML IJ SOLN
INTRAMUSCULAR | Status: DC | PRN
  Administered 2020-05-21: 30 mg via INTRAVENOUS

## 2020-05-21 MED ORDER — CHLORHEXIDINE GLUCONATE 0.12 % MT SOLN
15.0000 mL | Freq: Once | OROMUCOSAL | Status: AC
  Administered 2020-05-21: 15 mL via OROMUCOSAL

## 2020-05-21 MED ORDER — BUPIVACAINE LIPOSOME 1.3 % IJ SUSP
INTRAMUSCULAR | Status: AC
  Filled 2020-05-21: qty 20

## 2020-05-21 MED ORDER — BUPIVACAINE HCL (PF) 0.5 % IJ SOLN
INTRAMUSCULAR | Status: DC | PRN
  Administered 2020-05-21: 10 mL

## 2020-05-21 MED ORDER — ONDANSETRON HCL 4 MG/2ML IJ SOLN: 4.0000 mg | Freq: Once | INTRAMUSCULAR | Status: DC | PRN

## 2020-05-21 MED ORDER — BUPIVACAINE LIPOSOME 1.3 % IJ SUSP
INTRAMUSCULAR | Status: DC | PRN
  Administered 2020-05-21: 10 mL

## 2020-05-21 MED ORDER — FENTANYL CITRATE (PF) 100 MCG/2ML IJ SOLN: 50.0000 ug | Freq: Once | INTRAMUSCULAR | Status: AC

## 2020-05-21 MED ORDER — DEXAMETHASONE SODIUM PHOSPHATE 10 MG/ML IJ SOLN
INTRAMUSCULAR | Status: DC | PRN
  Administered 2020-05-21: 10 mg via INTRAVENOUS

## 2020-05-21 MED ORDER — PHENYLEPHRINE HCL-NACL 10-0.9 MG/250ML-% IV SOLN
INTRAVENOUS | Status: DC | PRN
  Administered 2020-05-21: 40 ug/min via INTRAVENOUS

## 2020-05-21 MED ORDER — OXYCODONE HCL 5 MG PO TABS: 5.0000 mg | ORAL_TABLET | ORAL | 0 refills | Status: DC | PRN

## 2020-05-21 MED ORDER — EPHEDRINE SULFATE 50 MG/ML IJ SOLN
INTRAMUSCULAR | Status: DC | PRN
  Administered 2020-05-21 (×2): 10 mg via INTRAVENOUS

## 2020-05-21 MED ORDER — ONDANSETRON HCL 4 MG/2ML IJ SOLN
INTRAMUSCULAR | Status: AC
  Filled 2020-05-21: qty 2

## 2020-05-21 MED ORDER — CHLORHEXIDINE GLUCONATE 0.12 % MT SOLN
OROMUCOSAL | Status: AC
  Filled 2020-05-21: qty 15

## 2020-05-21 MED ORDER — FENTANYL CITRATE (PF) 100 MCG/2ML IJ SOLN
INTRAMUSCULAR | Status: AC
  Administered 2020-05-21: 50 ug via INTRAVENOUS
  Filled 2020-05-21: qty 2

## 2020-05-21 MED ORDER — FENTANYL CITRATE (PF) 100 MCG/2ML IJ SOLN: 25.0000 ug | INTRAMUSCULAR | Status: DC | PRN

## 2020-05-21 MED ORDER — ACETAMINOPHEN 10 MG/ML IV SOLN
INTRAVENOUS | Status: DC | PRN
  Administered 2020-05-21: 1000 mg via INTRAVENOUS

## 2020-05-21 SURGICAL SUPPLY — 52 items
ANCH SUT 2 2.9 2 LD TPR NDL (Anchor) ×1 IMPLANT
ANCH SUT 2 2/0 ABS BRD STRL (Anchor) ×1 IMPLANT
ANCH SUT BN ASCP DLV (Anchor) ×1 IMPLANT
ANCH SUT RGNRT REGENETEN (Staple) ×1 IMPLANT
ANCHOR BONE REGENETEN (Anchor) ×2 IMPLANT
ANCHOR JUGGERKNOT WTAP NDL 2.9 (Anchor) ×2 IMPLANT
ANCHOR SUT W/ ORTHOCORD (Anchor) ×2 IMPLANT
ANCHOR TENDON REGENETEN (Staple) ×2 IMPLANT
APL PRP STRL LF DISP 70% ISPRP (MISCELLANEOUS) ×1
BIT DRILL JUGRKNT W/NDL BIT2.9 (DRILL) ×1 IMPLANT
BLADE FULL RADIUS 3.5 (BLADE) ×2 IMPLANT
BUR ACROMIONIZER 4.0 (BURR) ×2 IMPLANT
CANNULA SHAVER 8MMX76MM (CANNULA) ×2 IMPLANT
CHLORAPREP W/TINT 26 (MISCELLANEOUS) ×2 IMPLANT
COVER MAYO STAND REUSABLE (DRAPES) ×2 IMPLANT
COVER WAND RF STERILE (DRAPES) ×2 IMPLANT
DRAPE IMP U-DRAPE 54X76 (DRAPES) ×4 IMPLANT
DRILL JUGGERKNOT W/NDL BIT 2.9 (DRILL) ×2
ELECT REM PT RETURN 9FT ADLT (ELECTROSURGICAL) ×2
ELECTRODE REM PT RTRN 9FT ADLT (ELECTROSURGICAL) ×1 IMPLANT
GAUZE SPONGE 4X4 12PLY STRL (GAUZE/BANDAGES/DRESSINGS) ×2 IMPLANT
GAUZE XEROFORM 1X8 LF (GAUZE/BANDAGES/DRESSINGS) ×2 IMPLANT
GLOVE SRG 8 PF TXTR STRL LF DI (GLOVE) ×1 IMPLANT
GLOVE SURG ENC MOIS LTX SZ7.5 (GLOVE) ×4 IMPLANT
GLOVE SURG ENC MOIS LTX SZ8 (GLOVE) ×4 IMPLANT
GLOVE SURG UNDER LTX SZ8 (GLOVE) ×2 IMPLANT
GLOVE SURG UNDER POLY LF SZ8 (GLOVE) ×2
GOWN STRL REUS W/ TWL LRG LVL3 (GOWN DISPOSABLE) ×1 IMPLANT
GOWN STRL REUS W/ TWL XL LVL3 (GOWN DISPOSABLE) ×1 IMPLANT
GOWN STRL REUS W/TWL LRG LVL3 (GOWN DISPOSABLE) ×2
GOWN STRL REUS W/TWL XL LVL3 (GOWN DISPOSABLE) ×2
GRASPER SUT 15 45D LOW PRO (SUTURE) ×2 IMPLANT
IMPL REGENETEN MEDIUM (Shoulder) ×1 IMPLANT
IMPLANT REGENETEN MEDIUM (Shoulder) ×2 IMPLANT
IV LACTATED RINGER IRRG 3000ML (IV SOLUTION) ×2
IV LR IRRIG 3000ML ARTHROMATIC (IV SOLUTION) ×1 IMPLANT
KIT CANNULA 8X76-LX IN CANNULA (CANNULA) ×2 IMPLANT
MANIFOLD NEPTUNE II (INSTRUMENTS) ×4 IMPLANT
MASK FACE SPIDER DISP (MASK) ×2 IMPLANT
MAT ABSORB  FLUID 56X50 GRAY (MISCELLANEOUS) ×1
MAT ABSORB FLUID 56X50 GRAY (MISCELLANEOUS) ×1 IMPLANT
PACK ARTHROSCOPY SHOULDER (MISCELLANEOUS) ×2 IMPLANT
PENCIL SMOKE EVACUATOR (MISCELLANEOUS) ×2 IMPLANT
SLING ARM LRG DEEP (SOFTGOODS) ×2 IMPLANT
SLING ULTRA II LG (MISCELLANEOUS) ×2 IMPLANT
STAPLER SKIN PROX 35W (STAPLE) ×2 IMPLANT
STRAP SAFETY 5IN WIDE (MISCELLANEOUS) ×2 IMPLANT
SUT ETHIBOND 0 MO6 C/R (SUTURE) ×2 IMPLANT
SUT VIC AB 2-0 CT1 27 (SUTURE) ×4
SUT VIC AB 2-0 CT1 TAPERPNT 27 (SUTURE) ×2 IMPLANT
TAPE MICROFOAM 4IN (TAPE) ×2 IMPLANT
WAND WEREWOLF FLOW 90D (MISCELLANEOUS) ×2 IMPLANT

## 2020-05-21 NOTE — Anesthesia Procedure Notes (Signed)
Anesthesia Regional Block: Interscalene brachial plexus block   Pre-Anesthetic Checklist: ,, timeout performed, Correct Patient, Correct Site, Correct Laterality, Correct Procedure, Correct Position, site marked, Risks and benefits discussed,  Surgical consent,  Pre-op evaluation,  At surgeon's request and post-op pain management  Laterality: Left  Prep: chloraprep       Needles:  Injection technique: Single-shot  Needle Type: Echogenic Needle     Needle Length: 4cm  Needle Gauge: 25     Additional Needles:   Narrative:  Start time: 05/21/2020 9:36 AM End time: 05/21/2020 9:38 AM Injection made incrementally with aspirations every 5 mL.  Performed by: Personally  Anesthesiologist: Corinda Gubler, MD  Additional Notes: Patient's chart reviewed and they were deemed appropriate candidate for procedure, at surgeon's request. Patient educated about risks, benefits, and alternatives of the block including but not limited to: temporary or permanent nerve damage, bleeding, infection, damage to surround tissues, pneumothorax, hemidiaphragmatic paralysis, unilateral Horner's syndrome, block failure, local anesthetic toxicity. Patient expressed understanding. A formal time-out was conducted consistent with institution rules.  Monitors were applied, and minimal sedation used (see nursing record). The site was prepped with skin prep and allowed to dry, and sterile gloves were used. A high frequency linear ultrasound probe with probe cover was utilized throughout. C5-7 nerve roots located and appeared anatomically normal, local anesthetic injected around them, and echogenic block needle trajectory was monitored throughout. Aspiration performed every 66ml. Lung and blood vessels were avoided. All injections were performed without resistance and free of blood and paresthesias. The patient tolerated the procedure well.  Injectate: 25ml exparel + 64ml 0.5% bupivacaine

## 2020-05-21 NOTE — Anesthesia Preprocedure Evaluation (Signed)
Anesthesia Evaluation  Patient identified by MRN, date of birth, ID band Patient awake    Reviewed: Allergy & Precautions, NPO status , Patient's Chart, lab work & pertinent test results  History of Anesthesia Complications Negative for: history of anesthetic complications  Airway Mallampati: III  TM Distance: >3 FB Neck ROM: Full    Dental no notable dental hx. (+) Teeth Intact   Pulmonary neg sleep apnea, neg COPD, Current SmokerPatient did not abstain from smoking.,    Pulmonary exam normal breath sounds clear to auscultation       Cardiovascular Exercise Tolerance: Good METShypertension, Pt. on medications (-) CAD and (-) Past MI (-) dysrhythmias  Rhythm:Regular Rate:Normal - Systolic murmurs    Neuro/Psych negative neurological ROS  negative psych ROS   GI/Hepatic neg GERD  ,(+)     (-) substance abuse  ,   Endo/Other  neg diabetes  Renal/GU negative Renal ROS     Musculoskeletal  (+) Arthritis ,   Abdominal   Peds  Hematology   Anesthesia Other Findings Past Medical History: No date: Arthritis     Comment:  shoulders No date: Enlarged prostate No date: Hypercholesteremia No date: Hypertension  Reproductive/Obstetrics                             Anesthesia Physical Anesthesia Plan  ASA: II  Anesthesia Plan: General   Post-op Pain Management:  Regional for Post-op pain   Induction: Intravenous  PONV Risk Score and Plan: 1 and Ondansetron, Dexamethasone and Midazolam  Airway Management Planned: Oral ETT  Additional Equipment: None  Intra-op Plan:   Post-operative Plan: Extubation in OR  Informed Consent: I have reviewed the patients History and Physical, chart, labs and discussed the procedure including the risks, benefits and alternatives for the proposed anesthesia with the patient or authorized representative who has indicated his/her understanding and  acceptance.     Dental advisory given  Plan Discussed with: CRNA and Surgeon  Anesthesia Plan Comments: (Discussed risks of anesthesia with patient, including PONV, sore throat, lip/dental damage. Rare risks discussed as well, such as cardiorespiratory and neurological sequelae. Patient understands. Discussed r/b/a of interscalene block, including elective nature. Risks discussed: - Rare: bleeding, infection, nerve damage - shortness of breath from hemidiaphragmatic paralysis - unilateral horner's syndrome - poor/non-working blocks Patient understands and agrees. Patient counseled on benefits of smoking cessation, and increased perioperative risks associated with continued smoking. )        Anesthesia Quick Evaluation

## 2020-05-21 NOTE — Discharge Instructions (Addendum)
Orthopedic discharge instructions: °Keep dressing dry and intact.  °May shower after dressing changed on post-op day #4 (Monday).  °Cover staples with Band-Aids after drying off. °Apply ice frequently to shoulder. °Take ibuprofen 600-800 mg TID with meals for 7-10 days, then as necessary. °Take oxycodone as prescribed when needed.  °May supplement with ES Tylenol if necessary. °Keep shoulder immobilizer on at all times except may remove for bathing purposes. °Follow-up in 10-14 days or as scheduled. ° °AMBULATORY SURGERY  °DISCHARGE INSTRUCTIONS ° ° °1) The drugs that you were given will stay in your system until tomorrow so for the next 24 hours you should not: ° °A) Drive an automobile °B) Make any legal decisions °C) Drink any alcoholic beverage ° ° °2) You may resume regular meals tomorrow.  Today it is better to start with liquids and gradually work up to solid foods. ° °You may eat anything you prefer, but it is better to start with liquids, then soup and crackers, and gradually work up to solid foods. ° ° °3) Please notify your doctor immediately if you have any unusual bleeding, trouble breathing, redness and pain at the surgery site, drainage, fever, or pain not relieved by medication. ° ° ° °4) Additional Instructions: ° ° ° ° ° ° ° °Please contact your physician with any problems or Same Day Surgery at [phone], Monday through Friday 6 am to 4 pm, or [place] at  Main number at [phone]. ° ° ° ° °Interscalene Nerve Block with Exparel ° °1.  For your surgery you have received an Interscalene Nerve Block with Exparel. °2. Nerve Blocks affect many types of nerves, including nerves that control movement, pain and normal sensation.  You may experience feelings such as numbness, tingling, heaviness, weakness or the inability to move your arm or the feeling or sensation that your arm has "fallen asleep". °3. A nerve block with Exparel can last up to 5 days.  Usually the weakness wears off  first.  The tingling and heaviness usually wear off next.  Finally you may start to notice pain.  Keep in mind that this may occur in any order.  Once a nerve block starts to wear off it is usually completely gone within 60 minutes. °4. ISNB may cause mild shortness of breath, a hoarse voice, blurry vision, unequal pupils, or drooping of the face on the same side as the nerve block.  These symptoms will usually resolve with the numbness.  Very rarely the procedure itself can cause mild seizures. °5. If needed, your surgeon will give you a prescription for pain medication.  It will take about 60 minutes for the oral pain medication to become fully effective.  So, it is recommended that you start taking this medication before the nerve block first begins to wear off, or when you first begin to feel discomfort. °6. Take your pain medication only as prescribed.  Pain medication can cause sedation and decrease your breathing if you take more than you need for the level of pain that you have. °7. Nausea is a common side effect of many pain medications.  You may want to eat something before taking your pain medicine to prevent nausea. °8. After an Interscalene nerve block, you cannot feel pain, pressure or extremes in temperature in the effected arm.  Because your arm is numb it is at an increased risk for injury.  To decrease the possibility of injury, please practice the following: ° °a. While you are awake change the   position of your arm frequently to prevent too much pressure on any one area for prolonged periods of time. °b.  If you have a cast or tight dressing, check the color or your fingers every couple of hours.  Call your surgeon with the appearance of any discoloration (white or blue). °c. If you are given a sling to wear before you go home, please wear it  at all times until the block has completely worn off.  Do not get up at night without your sling. °d. Please contact [facility] or your surgeon if you  do not begin to regain sensation after 7 days from the surgery.  Anesthesia may be contacted by calling the Same Day Surgery Department, Mon. through Fri., 6 am to 4 pm at [phone].   °e. If you experience any other problems or concerns, please contact your surgeon's office. °f. If you experience severe or prolonged shortness of breath go to the nearest emergency department. °

## 2020-05-21 NOTE — Anesthesia Postprocedure Evaluation (Signed)
Anesthesia Post Note  Patient: Tim Coluccio [place] Jr.  Procedure(s) Performed: ARTHROSCOPY SHOULDER WITH DEBRIDEMENT, DECOMPRESSION, REPAIR OF RECURRENT ROTATOR CUFF TEAR (Left Shoulder)  Patient location during evaluation: PACU Anesthesia Type: General Level of consciousness: awake and alert Pain management: pain level controlled Vital Signs Assessment: post-procedure vital signs reviewed and stable Respiratory status: spontaneous breathing, nonlabored ventilation, respiratory function stable and patient connected to nasal cannula oxygen Cardiovascular status: blood pressure returned to baseline and stable Postop Assessment: no apparent nausea or vomiting Anesthetic complications: no   No complications documented.   Last Vitals:  Vitals:   05/21/20 1330 05/21/20 1345  BP: 123/74 126/80  Pulse: (!) 57 (!) 55  Resp: 16 16  Temp:  36.4 C  SpO2: 99% 98%    Last Pain:  Vitals:   05/21/20 1345  TempSrc:   PainSc: 0-No pain                 Corinda Gubler

## 2020-05-21 NOTE — Transfer of Care (Signed)
Immediate Anesthesia Transfer of Care Note  Patient: Tim Cayson George Jr.  Procedure(s) Performed: ARTHROSCOPY SHOULDER WITH DEBRIDEMENT, DECOMPRESSION, REPAIR OF RECURRENT ROTATOR CUFF TEAR (Left Shoulder)  Patient Location: PACU  Anesthesia Type:GA combined with regional for post-op pain  Level of Consciousness: awake  Airway & Oxygen Therapy: Patient Spontanous Breathing and Patient connected to face mask oxygen  Post-op Assessment: Report given to RN and Post -op Vital signs reviewed and stable  Post vital signs: Reviewed and stable  Last Vitals:  Vitals Value Taken Time  BP 117/75 05/21/20 1303  Temp 36.1 C 05/21/20 1303  Pulse 63 05/21/20 1310  Resp 15 05/21/20 1310  SpO2 100 % 05/21/20 1310  Vitals shown include unvalidated device data.  Last Pain:  Vitals:   05/21/20 1303  TempSrc:   PainSc: Asleep         Complications: No complications documented.

## 2020-05-21 NOTE — H&P (Signed)
History of Present Illness:  Tim Cox. is a 65 y.o. male who presents for follow-up of his left shoulder pain following a prior rotator cuff repair. The patient notes little change in his symptoms since his last visit 3 weeks ago. He continues to experience pain with activities at or above shoulder level as well as when trying to reach behind his back. In fact, he feels that his symptoms may be slightly worse as he notes that these symptoms do not resolve after getting home from work as quickly as they were previously. He has pain at night. He has been taking Tylenol as necessary with limited benefit. He denies any reinjury to the shoulder, and denies any fevers or chills. He continues to try to work but finds it is becoming increasingly difficult to tolerate. Since his last visit, he has undergone an MRI scan of the left shoulder and presents today to review these results.  Current Outpatient Medications: . acetaminophen (TYLENOL) 500 MG tablet Take 1,000 mg by mouth 2 (two) times daily as needed for Pain.  Marland Kitchen aspirin 81 MG EC tablet Take 81 mg by mouth once daily  . lisinopriL-hydrochlorothiazide (ZESTORETIC) 10-12.5 mg tablet TAKE 1 TABLET BY MOUTH EVERY DAY 90 tablet 1  . lovastatin (MEVACOR) 20 MG tablet TAKE 1 TABLET(20 MG) BY MOUTH EVERY NIGHT 90 tablet 1  . tamsulosin (FLOMAX) 0.4 mg capsule Take 0.4 mg by mouth once daily  . traZODone (DESYREL) 50 MG tablet Take 1 tablet (50 mg total) by mouth nightly as needed 90 tablet 1   Allergies: No Known Allergies  Past Medical History:  . Arthritis  . Borderline diabetes  . Enlarged prostate  . HLD (hyperlipidemia)  . HTN (hypertension)  . Low testosterone  . Tobacco dependence   Past Surgical History:  . COLONOSCOPY 01/16/2004 (normal colon)  . COLONOSCOPY 03/24/2014 (Entire examined colon is normal/Repeat 30yrs/PYO)  . Extensive arthroscopic debridment, arthroscopic subacromial decompression, mini-open rotator cuff repair, and  mini-open biceps tendoesis, right shoulder Right 11/04/2015 (Dr. Joice Lofts)  . Left great toe surgery  . Left mini open rotator cuff repair and decompression 12/10/07  . Left shoulder surgery 1969  . Pilonidal cyst removal 1993, 1995  . TONSILLECTOMY   Family History:  . Prostate cancer Father  . High blood pressure (Hypertension) Father  . Hyperlipidemia (Elevated cholesterol) Father  . Arthritis Father  . Aneurysm Father  . COPD Mother  . High blood pressure (Hypertension) Brother  . High blood pressure (Hypertension) Sister  . Hyperlipidemia (Elevated cholesterol) Brother  . Hyperlipidemia (Elevated cholesterol) Sister   Social History:   Socioeconomic History:  Marland Kitchen Marital status: Married  Spouse name: Not on file  . Number of children: Not on file  . Years of education: Not on file  . Highest education level: Not on file  Occupational History  . Not on file  Tobacco Use  . Smoking status: Current Every Day Smoker  Packs/day: 0.50  Years: 15.00  Pack years: 7.50  Types: Cigarettes  . Smokeless tobacco: Former Neurosurgeon  Types: Chew  Quit date: 01/18/1983  Vaping Use  . Vaping Use: Never used  Substance and Sexual Activity  . Alcohol use: Yes  Alcohol/week: 0.0 standard drinks  Comment: Drinks 3-4 beers weekly  . Drug use: No  . Sexual activity: Defer  Other Topics Concern  . Not on file  Social History Narrative  Marital status- Married  Lives with wife  Employment- Environmental health practitioner  Exercise hx- Havy-lifting  and walking  Religious affliation- Christian   Social Determinants of Health:   Physicist, medical Strain: Not on file  Food Insecurity: Not on file  Transportation Needs: Not on file   Review of Systems:  A comprehensive 14 point ROS was performed, reviewed, and the pertinent orthopaedic findings are documented in the HPI.  Physical Exam: Vitals:  04/03/20 1131  BP: 118/78  Weight: 77.7 kg (171 lb 6.4 oz)  Height: 172.7 cm (5\' 8" )  PainSc: 6   PainLoc: Shoulder   General/Constitutional: The patient appears to be well-nourished, well-developed, and in no acute distress. Neuro/Psych: Normal mood and affect, oriented to person, place and time. Eyes: Non-icteric. Pupils are equal, round, and reactive to light, and exhibit synchronous movement. ENT: Unremarkable. Lymphatic: No palpable adenopathy. Respiratory: Lungs clear to auscultation, Normal chest excursion, No wheezes and Non-labored breathing Cardiovascular: Regular rate and rhythm. No murmurs. and No edema, swelling or tenderness, except as noted in detailed exam. Integumentary: No impressive skin lesions present, except as noted in detailed exam. Musculoskeletal: Unremarkable, except as noted in detailed exam.  Left shoulder exam: SKIN: Well-healed surgical incision, otherwise unremarkable SWELLING: None WARMTH: None LYMPH NODES: No adenopathy palpable CREPITUS: None TENDERNESS: Mild tenderness over anterolateral shoulder ROM (active):  Forward flexion: 155 degrees Abduction: 150 degrees Internal rotation: L2 ROM (passive):  Forward flexion: 160 degrees Abduction: 155 degrees  ER/IR at 90 abd: 90 degrees/55 degrees  He again describes mild-moderate pain with forward flexion and abduction, and mild pain with internal rotation as well as with internal and external rotation at 90 degrees of abduction.  STRENGTH: Forward flexion: 4/5 Abduction: 4-4+/5 External rotation: 4-4+/5 Internal rotation: 4+/5 Pain with RC testing: Moderate pain with resisted forward flexion and mild-moderate pain with resisted abduction  STABILITY: Normal  SPECIAL TESTS: ' test: Mildly positive Speed's test: Mildly positive Capsulitis - pain w/ passive ER: No Crossed arm test: Minimally positive Crank: Not evaluated Anterior apprehension: Negative Posterior apprehension: Not evaluated  He remains neurovascularly intact to the left upper extremity.  X-rays/MRI/Lab data:   A recent MRI scan of the left shoulder has been obtained and is available for review. By report, the scan demonstrates evidence of a recurrent bursal-sided partial-thickness tear of the supraspinatus tendon without retraction. Minimal degenerative changes of the glenohumeral joint are noted, as are more mild degenerative changes of the [facility] joint. The remainder of the rotator cuff appears to be in satisfactory condition. Both the films report were reviewed by myself and discussed with the patient.  Assessment: . Nontraumatic complete tear of left rotator cuff.  . Primary osteoarthritis of left shoulder.  . Status post left rotator cuff repair.   Plan: The treatment options were discussed with the patient. In addition, patient educational materials were provided regarding the diagnosis and treatment options. The patient is quite frustrated by his persistent symptoms and function limitations, and is ready to consider more aggressive treatment options. Therefore, I have recommended a surgical procedure, specifically a left shoulder arthroscopy with debridement, decompression, and repair of his recurrent rotator cuff tear. The procedure was discussed with the patient, as were the potential risks (including bleeding, infection, nerve and/or blood vessel injury, persistent or recurrent pain, failure of the repair, progression of arthritis, need for further surgery, blood clots, strokes, heart attacks and/or arhythmias, pneumonia, etc.) and benefits. The patient states his understanding and wishes to proceed. All of the patient's questions and concerns were answered. He can call any time with further concerns. He will follow up  post-surgery, routine. He may continue to work without restrictions until his surgery.   H&P reviewed and patient re-examined. No changes.

## 2020-05-21 NOTE — Anesthesia Procedure Notes (Signed)
Procedure Name: Intubation Date/Time: 05/21/2020 11:01 AM Performed by: Henrietta Hoover, CRNA Pre-anesthesia Checklist: Patient identified, Emergency Drugs available, Suction available and Patient being monitored Patient Re-evaluated:Patient Re-evaluated prior to induction Oxygen Delivery Method: Circle system utilized Preoxygenation: Pre-oxygenation with 100% oxygen Induction Type: IV induction Ventilation: Oral airway inserted - appropriate to patient size Laryngoscope Size: Glidescope and 4 Grade View: Grade I Tube type: Oral Tube size: 7.0 mm Number of attempts: 1 Airway Equipment and Method: Stylet and Oral airway Placement Confirmation: ETT inserted through vocal cords under direct vision,  positive ETCO2 and breath sounds checked- equal and bilateral Secured at: 22 cm Tube secured with: Tape Dental Injury: Teeth and Oropharynx as per pre-operative assessment

## 2020-05-21 NOTE — Op Note (Signed)
05/21/2020  1:10 PM  Patient:   Tim Fennell Toro Jr.  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  Post-Op Diagnosis:   Impingement/tendinopathy with recurrent partial-thickness rotator cuff tear, large type II SLAP tear, moderate glenohumeral arthrosis, and biceps tendinopathy, left shoulder.  Procedure:   Extensive arthroscopic debridement, arthroscopic SLAP repair, arthroscopic subacromial decompression, mini-open rotator cuff repair augmented with a Smith & Nephew Regeneten patch, and mini-open biceps tenodesis, left shoulder.  Anesthesia:   General endotracheal with interscalene block using Exparel placed preoperatively by the anesthesiologist.  Surgeon:   Maryagnes Amos, MD  Assistant:   Griffin Basil, RNFA; Brigid Re, PA-S  Findings:   As above.  There was a large 3 to labral tear extending from the 10 o'clock position to the 2 o'clock position with subluxation.  The biceps tendon demonstrated moderate tendinopathic changes without partial or full-thickness tears.  There was a bursal-sided recurrent partial-thickness tearing of the involving the mid supraspinatus insertional fibers extending approximately 75% of the thickness of the rotator cuff tendon.  There also is mild fraying articular insertional fibers of the superior portion of the subscapularis tendon without significant compromise of the footprint.  There were grade 2-3 chondromalacial changes involving the superior and mid-portions of the humeral articular surface.  There were grade 3-4 chondromalacial changes involving the superior half of the glenoid articular surface.  Complications:   None  Fluids:   800 cc  Estimated blood loss:   10 cc  Tourniquet time:   None  Drains:   None  Closure:   Staples      Brief clinical note:   The patient is a 65 year old male with a 1+ year history of progressively worsening right shoulder  pain. The patient is now 12-13 years status post a left rotator cuff repair by Dr. Rosita Kea, from which he had done quite well until these symptoms developed. The patient's symptoms have progressed despite medications, activity modification, etc. The patient's history and examination are consistent with impingement/tendinopathy with a probable recurrent rotator cuff tear. These findings were confirmed by MRI scan. The patient presents at this time for definitive management of these shoulder symptoms.  Procedure:   The patient underwent placement of an interscalene block using Exparel by the anesthesiologist in the preoperative holding area before being brought into the operating room and lain in the supine position. The patient then underwent general endotracheal intubation and anesthesia before being repositioned in the beach chair position using the beach chair positioner. The left shoulder and upper extremity were prepped with ChloraPrep solution before being draped sterilely. Preoperative antibiotics were administered. A timeout was performed to confirm the proper surgical site before the expected portal sites and incision site were injected with 0.5% Sensorcaine with epinephrine.   A posterior portal was created and the glenohumeral joint thoroughly inspected with the findings as described above. An anterior portal was created using an outside-in technique. The labrum and rotator cuff were further probed, again confirming the above-noted findings. Areas of labral fraying were debrided back to stable margins using the full-radius resector, as were areas of synovitis anteriorly and superiorly. The frayed fibers of the articular sided partial-thickness tear of the subscapularis tendon also were debrided back to stable margins using the full-radius resector, as were areas of degenerative changes on the glenoid and humeral surfaces. The ArthroCare wand was inserted and used to release the biceps tendon from its  labral anchor. It also was used  to obtain hemostasis  A separate superolateral portal site was created using an outside in technique before the superior rim of the glenoid was roughened with a full-radius resector. A single Mytek Bioknotless anchor was placed in the 11:30 to 12 o'clock position to stabilize the labrum. Subsequent probing of the repair demonstrated excellent stability. The instruments were removed from the joint after suctioning the excess fluid.  The camera was repositioned through the posterior portal into the subacromial space. A separate lateral portal was created using an outside-in technique. The 3.5 mm full-radius resector was introduced and used to perform a subtotal bursectomy. The ArthroCare wand was then inserted and used to remove the periosteal tissue off the undersurface of the anterior third of the acromion as well as to recess the coracoacromial ligament from its attachment along the anterior and lateral margins of the acromion. The 4.0 mm acromionizing bur was introduced and used to complete the decompression by removing the undersurface of the anterior third of the acromion. The full radius resector was reintroduced to remove any residual bony debris before the ArthroCare wand was reintroduced to obtain hemostasis. The instruments were then removed from the subacromial space after suctioning the excess fluid.  An approximately 4-5 cm incision was made over the anterolateral aspect of the shoulder beginning at the anterolateral corner of the acromion and extending distally in line with the bicipital groove. This incision was carried down through the subcutaneous tissues to expose the deltoid fascia. The raphae between the anterior and middle thirds was identified and this plane developed to provide access into the subacromial space. Additional bursal tissues were debrided sharply using Metzenbaum scissors. The rotator cuff tear was readily identified. The bursal-sided  partial-thickness tear was primarily a longitudinal split tendon fibers. It was repaired using two #0 Ethibond sutures placed in a side-to-side fashion. Given the patient's age and his current tear, it was felt best to reinforce this repair with a Katrinka Blazing & Nephew Regeneten patch. A medium patch was selected and secured over the repair using the appropriate bone and soft tissue staples. An apparent watertight closure was obtained.  The bicipital groove was identified by palpation and opened for 1-1.5 cm. The biceps tendon stump was retrieved through this defect. The floor of the bicipital groove was roughened with a curet before another Biomet 2.9 mm JuggerKnot anchor was inserted. Both sets of sutures were passed through the biceps tendon and tied securely to effect the tenodesis. The bicipital sheath was reapproximated using two #0 Ethibond interrupted sutures, incorporating the biceps tendon to further reinforce the tenodesis.  The wound was copiously irrigated with sterile saline solution before the deltoid raphae was reapproximated using 2-0 Vicryl interrupted sutures. The subcutaneous tissues were closed in two layers using 2-0 Vicryl interrupted sutures before the skin was closed using staples. The portal sites also were closed using staples. A sterile bulky dressing was applied to the shoulder before the arm was placed into a shoulder immobilizer. The patient was then awakened, extubated, and returned to the recovery room in satisfactory condition after tolerating the procedure well.

## 2020-05-22 ENCOUNTER — Encounter: Payer: Self-pay | Admitting: Surgery

## 2021-10-17 IMAGING — MR MR SHOULDER*L* W/O CM
5 series · 36 of 40 positions shown · non-contrast
Comparison: 10/19/2007

CLINICAL DATA: Left shoulder pain, limited range of motion

EXAM:
MRI OF THE LEFT SHOULDER WITHOUT CONTRAST
TECHNIQUE: Multiplanar, multisequence MR imaging of the shoulder was performed.
No intravenous contrast was administered.

[Series 3: T2 fat-sat · axial · left · 4.0mm · 0.44mm/px · z∈[-38,+82]mm · 9 of 26 slices shown (1 of 3)]
[im 1/26]
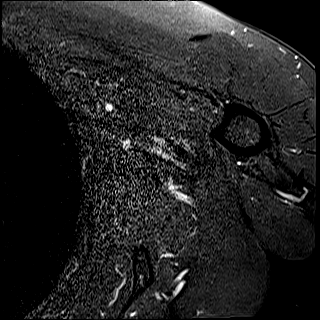
[im 4/26]
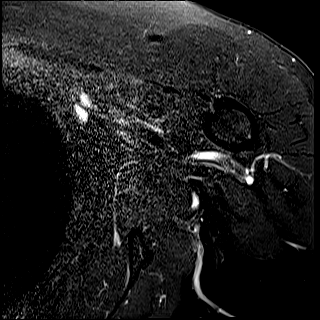
[im 7/26]
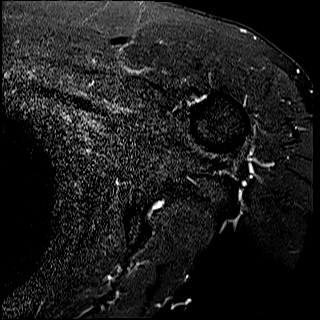
[im 10/26]
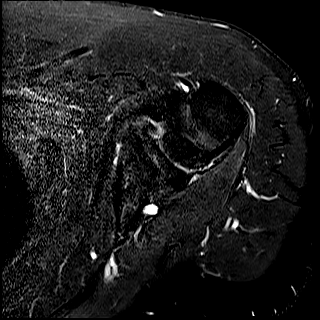
[im 13/26]
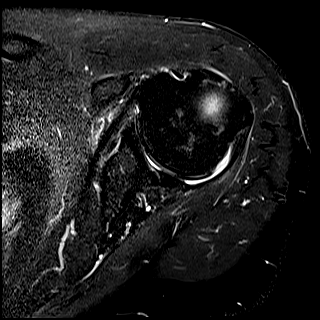
[im 16/26]
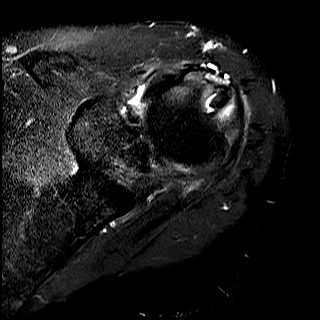
[im 19/26]
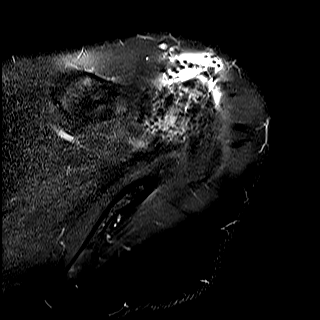
[im 22/26]
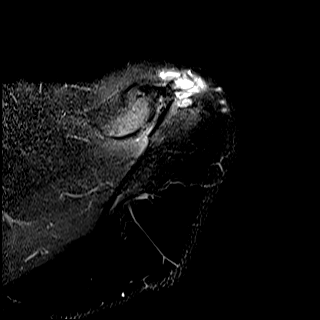
[im 26/26]
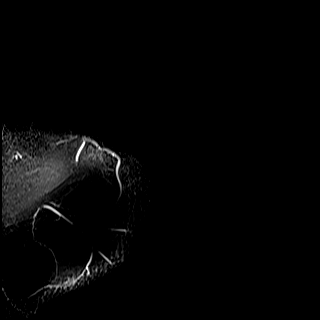

[Series 4: PD · oblique · left · 4.0mm · 0.44mm/px · 8 of 26 slices shown]
[im 1/26]
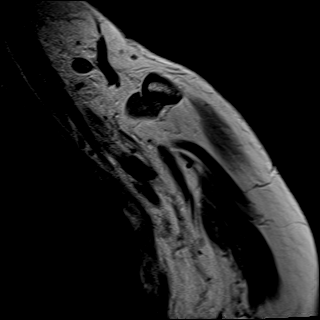
[im 4/26]
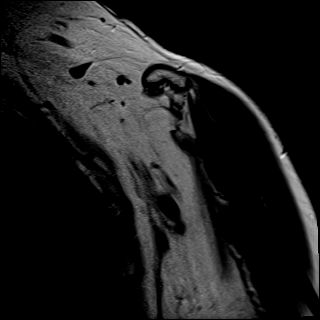
[im 8/26]
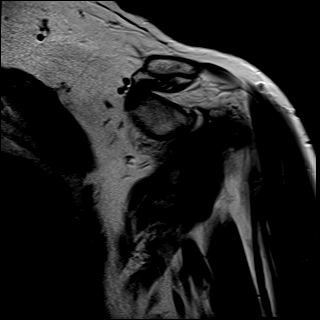
[im 11/26]
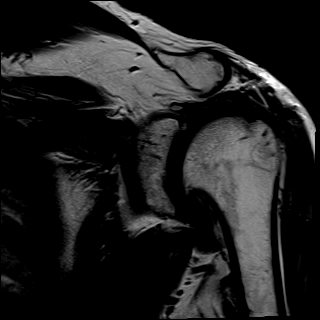
[im 15/26]
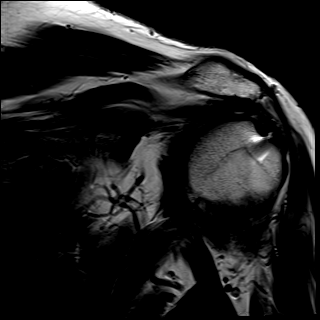
[im 18/26]
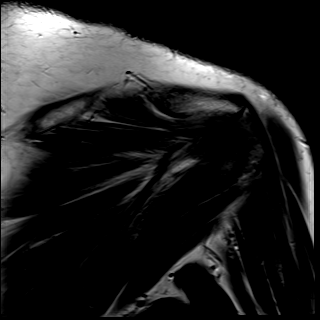
[im 22/26]
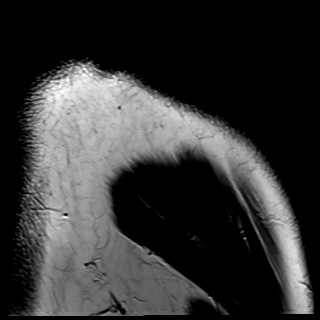
[im 26/26]
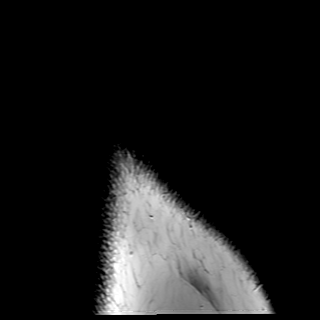

[Series 5: T2 fat-sat · oblique · left · 4.0mm · 0.44mm/px · 8 of 26 slices shown (2 of 3)]
[im 1/26]
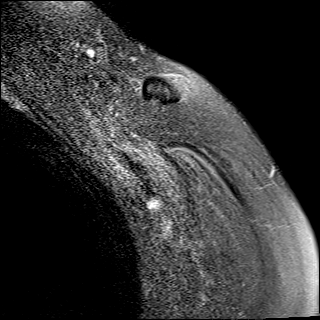
[im 4/26]
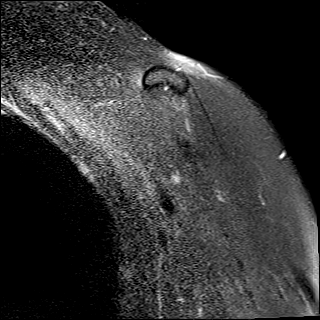
[im 8/26]
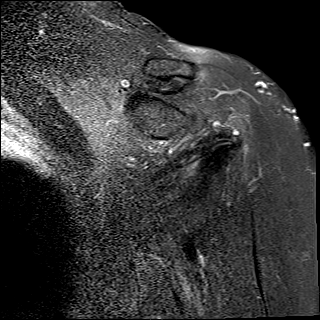
[im 11/26]
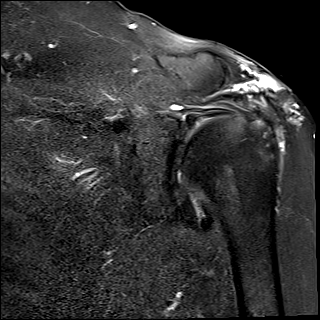
[im 15/26]
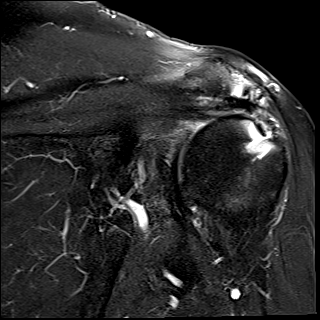
[im 18/26]
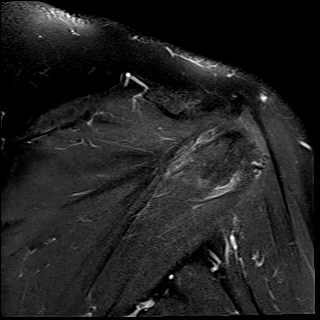
[im 22/26]
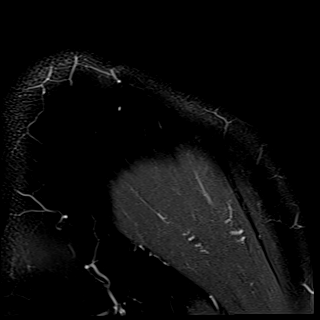
[im 26/26]
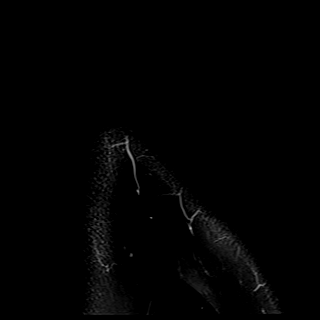

[Series 6: T2 fat-sat · oblique · left · 4.0mm · 0.27mm/px · 7 of 22 slices shown (3 of 3)]
[im 1/22]
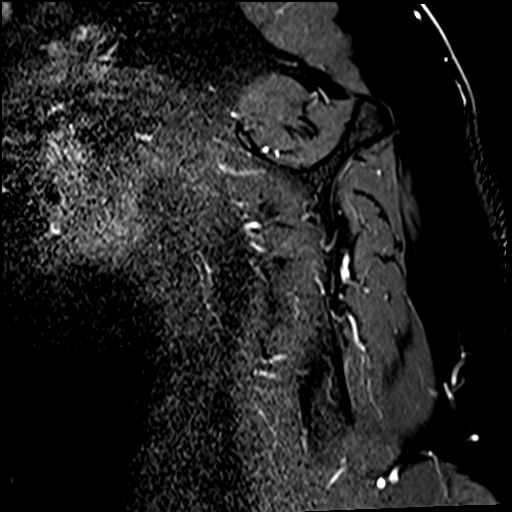
[im 4/22]
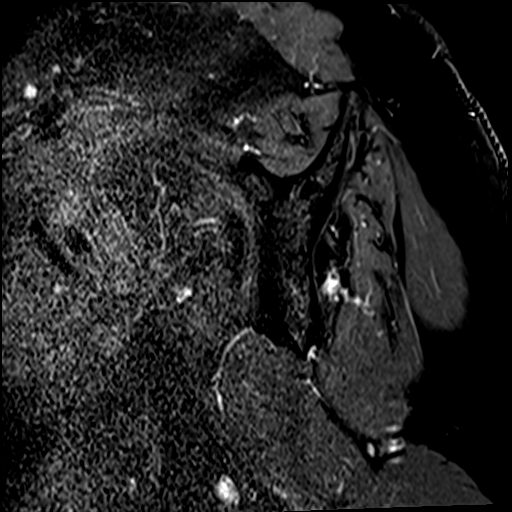
[im 8/22]
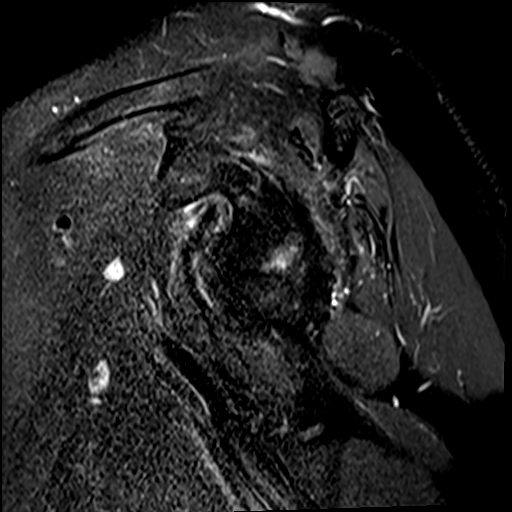
[im 11/22]
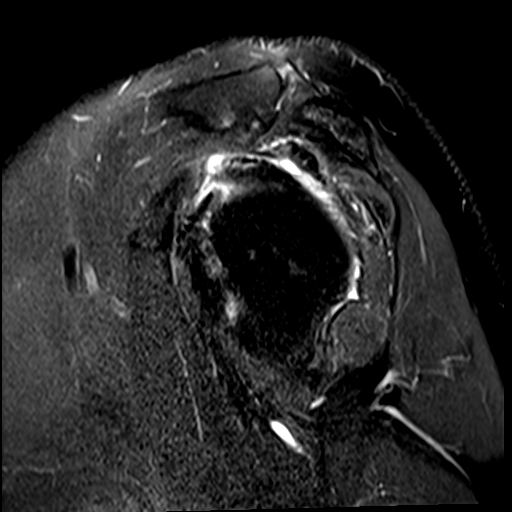
[im 15/22]
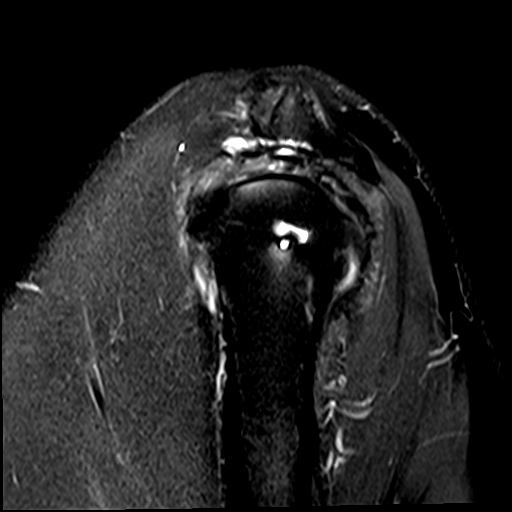
[im 18/22]
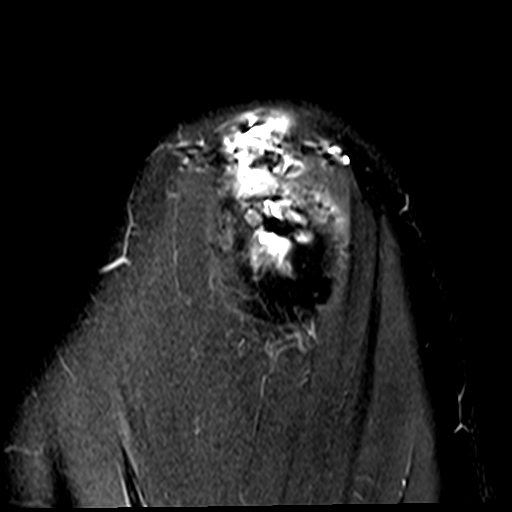
[im 22/22]
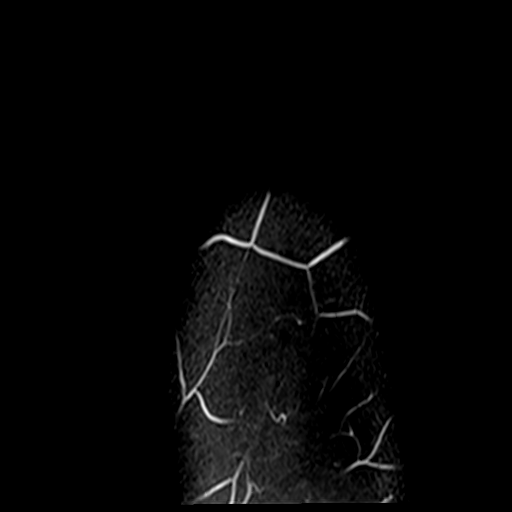

[Series 7: T1 · oblique · left · 4.0mm · 0.44mm/px · 4 of 24 slices shown]
[im 1/24]
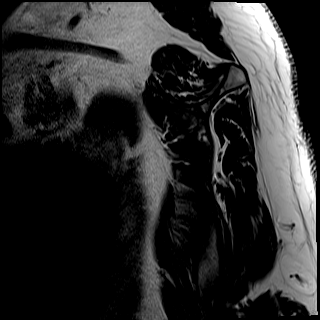
[im 4/24]
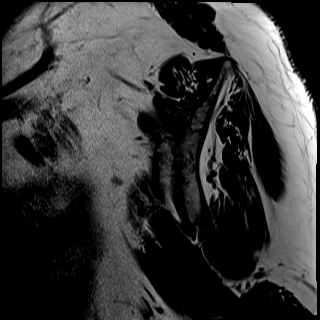
[im 7/24]
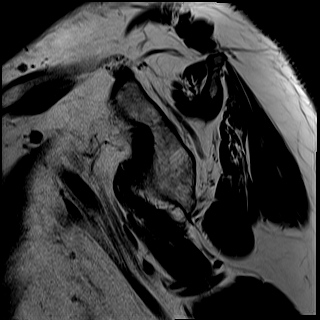
[im 10/24]
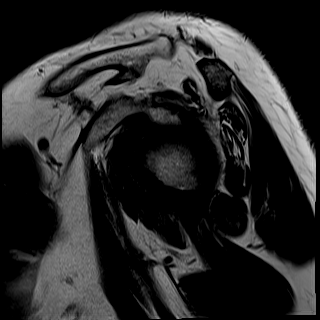

[36 of 40 positions shown; findings below may reference images not displayed]

FINDINGS: Rotator cuff: Prior rotator cuff repair. High-grade
partial-thickness bursal surface tear of the supraspinatus tendon
with underlying moderate tendinosis. Mild tendinosis of the
infraspinatus tendon. Teres minor tendon is intact. Subscapularis
tendon is intact.

Muscles: No muscle atrophy or edema. No intramuscular fluid
collection or hematoma.

Biceps Long Head: Intraarticular and extraarticular portions of the
biceps tendon are intact.

Acromioclavicular Joint: Moderate arthropathy of the
acromioclavicular joint. Prior subacromial decompression. No
subacromial/subdeltoid bursal fluid.

Glenohumeral Joint: No joint effusion. Mild partial-thickness
cartilage loss of the glenohumeral joint.

Labrum: Grossly intact, but evaluation is limited by lack of
intraarticular fluid/contrast.

Bones: No fracture or dislocation. No aggressive osseous lesion.

Other: No fluid collection or hematoma.
IMPRESSION: 1. Prior rotator cuff repair. High-grade partial-thickness bursal
surface tear of the supraspinatus tendon with underlying moderate
tendinosis.
2. Mild tendinosis of the infraspinatus tendon.

## 2023-04-20 ENCOUNTER — Other Ambulatory Visit: Payer: Self-pay

## 2023-04-20 ENCOUNTER — Emergency Department
Admission: EM | Admit: 2023-04-20 | Discharge: 2023-04-20 | Disposition: A | Discharge status: Home / Self Care | Payer: Self-pay | Attending: Emergency Medicine | Admitting: Emergency Medicine

## 2023-04-20 ENCOUNTER — Encounter: Payer: Self-pay | Admitting: Emergency Medicine

## 2023-04-20 ENCOUNTER — Emergency Department: Payer: Self-pay

## 2023-04-20 DIAGNOSIS — R1011 Right upper quadrant pain: Secondary | ICD-10-CM | POA: Diagnosis not present

## 2023-04-20 DIAGNOSIS — I10 Essential (primary) hypertension: Secondary | ICD-10-CM | POA: Insufficient documentation

## 2023-04-20 DIAGNOSIS — X58XXXA Exposure to other specified factors, initial encounter: Secondary | ICD-10-CM | POA: Diagnosis not present

## 2023-04-20 DIAGNOSIS — S29001A Unspecified injury of muscle and tendon of front wall of thorax, initial encounter: Secondary | ICD-10-CM | POA: Diagnosis present

## 2023-04-20 DIAGNOSIS — S29011A Strain of muscle and tendon of front wall of thorax, initial encounter: Secondary | ICD-10-CM | POA: Insufficient documentation

## 2023-04-20 LAB — URINALYSIS, ROUTINE W REFLEX MICROSCOPIC
Bilirubin Urine: NEGATIVE
Glucose, UA: NEGATIVE mg/dL
Hgb urine dipstick: NEGATIVE
Ketones, ur: NEGATIVE mg/dL
Leukocytes,Ua: NEGATIVE
Nitrite: NEGATIVE
Protein, ur: NEGATIVE mg/dL
Specific Gravity, Urine: 1.018 (ref 1.005–1.030)
pH: 5 (ref 5.0–8.0)

## 2023-04-20 LAB — CBC
HCT: 44.1 % (ref 39.0–52.0)
Hemoglobin: 15.1 g/dL (ref 13.0–17.0)
MCH: 30.6 pg (ref 26.0–34.0)
MCHC: 34.2 g/dL (ref 30.0–36.0)
MCV: 89.3 fL (ref 80.0–100.0)
Platelets: 249 10*3/uL (ref 150–400)
RBC: 4.94 MIL/uL (ref 4.22–5.81)
RDW: 13.5 % (ref 11.5–15.5)
WBC: 9 10*3/uL (ref 4.0–10.5)
nRBC: 0 % (ref 0.0–0.2)

## 2023-04-20 LAB — COMPREHENSIVE METABOLIC PANEL WITH GFR
ALT: 28 U/L (ref 0–44)
AST: 21 U/L (ref 15–41)
Albumin: 4.2 g/dL (ref 3.5–5.0)
Alkaline Phosphatase: 73 U/L (ref 38–126)
Anion gap: 8 (ref 5–15)
BUN: 22 mg/dL (ref 8–23)
CO2: 28 mmol/L (ref 22–32)
Calcium: 9.4 mg/dL (ref 8.9–10.3)
Chloride: 103 mmol/L (ref 98–111)
Creatinine, Ser: 0.95 mg/dL (ref 0.61–1.24)
GFR, Estimated: >60 mL/min (ref >60)
Glucose, Bld: 101 mg/dL — ABNORMAL HIGH (ref 70–99)
Potassium: 4.9 mmol/L (ref 3.5–5.1)
Sodium: 139 mmol/L (ref 135–145)
Total Bilirubin: 0.6 mg/dL (ref 0.0–1.2)
Total Protein: 7.1 g/dL (ref 6.5–8.1)

## 2023-04-20 LAB — LIPASE, BLOOD: Lipase: 55 U/L — ABNORMAL HIGH (ref 11–51)

## 2023-04-20 MED ORDER — CYCLOBENZAPRINE HCL 7.5 MG PO TABS: 7.5000 mg | ORAL_TABLET | Freq: Three times a day (TID) | ORAL | 0 refills | Status: AC | PRN

## 2023-04-20 NOTE — ED Provider Notes (Signed)
 [name] Provider Note    Event Date/Time   First MD Initiated Contact with Patient 04/20/23 [phone]     (approximate)   History   Back Pain   HPI  Tim Cox. is a 68 y.o. male   has a history of hypertension and hypercholesterolemia   Patient reports about 4 5 days ago he was looking around his house, doing some bending and looking when he started noticing an achy discomfort in his right low back along his right lower ribs.  It was very mild, over the last couple days the pain has become more across his right upper abdomen.  He is able to eat and drink normally.  No nausea or vomiting.  Normal bowel movement yesterday.  To urgent care because the symptoms have persisted for the last 4 to 5 days.  There is he was recommended to have an ultrasound of his gallbladder.  Reports she has labs done by his primary care doctor Tim Cox primary care visit just a couple days ago    Denies chest pain.  No difficulty breathing.  No fevers or chills.  Ate normal breakfast today.  Currently reports the pain is subsided  Urgent care note by Dr. Wallene Huh today "68 year old male comes in today with reports of right upper quadrant abdominal pain over the past 4 to 5 days, worse over the past 24 hours. Pain is worse with movement, with direct palpation. Pain is located in the right upper quadrant, radiates up under the right scapula. Reports bloating, constipation, nausea. Did have labs with his primary care provider yesterday in anticipation of physical next week (labs reviewed as below). No new foods. No recent travel. No trauma. Recommend to ER for further evaluation and management, consideration of imaging of right upper quadrant including gallbladder "  Physical Exam   Triage Vital Signs: ED Triage Vitals  Encounter Vitals Group     BP 04/20/23 0855 (!) 148/100     Systolic BP Percentile --      Diastolic BP Percentile --      Pulse Rate 04/20/23 0855 86      Resp 04/20/23 0855 17     Temp 04/20/23 0855 97.8 F (36.6 C)     Temp Source 04/20/23 0855 Oral     SpO2 04/20/23 0855 98 %     Weight 04/20/23 0856 173 lb 15.1 oz (78.9 kg)     Height 04/20/23 0856 5' 7.5" (1.715 m)     Head Circumference --      Peak Flow --      Pain Score 04/20/23 0855 5     Pain Loc --      Pain Education --      Exclude from Growth Chart --     Most recent vital signs: Vitals:   04/20/23 0855 04/20/23 1306  BP: (!) 148/100 (!) 140/89  Pulse: 86 78  Resp: 17 16  Temp: 97.8 F (36.6 C) 98 F (36.7 C)  SpO2: 98% 98%     General: Awake, no distress.  Very pleasantCV:  Good peripheral perfusion.  Normal tones and rate Resp:  Normal effort.  Clear bilateral Abd:  No distention.  Soft nontender nondistended at this time.  Points in his mid right upper quadrant as the source of where the pain will be when it is present, currently reports that has subsided Other:    Symptoms worsen when bending forward.   ED Results / Procedures / Treatments  Labs (all labs ordered are listed, but only abnormal results are displayed) Labs Reviewed  URINALYSIS, ROUTINE W REFLEX MICROSCOPIC - Abnormal; Notable for the following components:      Result Value   Color, Urine YELLOW (*)    APPearance CLEAR (*)    All other components within normal limits  COMPREHENSIVE METABOLIC PANEL WITH GFR - Abnormal; Notable for the following components:   Glucose, Bld 101 (*)    All other components within normal limits  LIPASE, BLOOD - Abnormal; Notable for the following components:   Lipase 55 (*)    All other components within normal limits  CBC     EKG  Interpreted by me at 1310 heart rate 65 QRS 80 QTc 440, no evidence of acute ischemia   RADIOLOGY  Chest x-ray inter by me is normal   DG Chest 2 View Result Date: 04/20/2023 CLINICAL DATA:  ruq pain vs R lower chest pain. EXAM: CHEST - 2 VIEW COMPARISON:  12/03/2007. FINDINGS: Bilateral lung fields are clear.  Bilateral costophrenic angles are clear. Normal cardio-mediastinal silhouette. No acute osseous abnormalities. The soft tissues are within normal limits. IMPRESSION: No active cardiopulmonary disease. Electronically Signed   By: Jules Schick M.D.   On: 04/20/2023 11:50   US ABDOMEN LIMITED RUQ (LIVER/GB) Result Date: 04/20/2023 CLINICAL DATA:  Abdominal pain EXAM: ULTRASOUND ABDOMEN LIMITED RIGHT UPPER QUADRANT COMPARISON:  None Available. FINDINGS: Gallbladder: No gallstones or wall thickening visualized. No sonographic Murphy sign noted by sonographer. Common bile duct: Diameter: 3 mm Liver: Increased echogenicity. No focal lesion. Portal vein is patent on color Doppler imaging with normal direction of blood flow towards the liver. Other: None. IMPRESSION: 1. Increased hepatic parenchymal echogenicity suggestive of steatosis. 2. No cholelithiasis or sonographic evidence for acute cholecystitis. Electronically Signed   By: Annia Belt M.D.   On: 04/20/2023 10:42      PROCEDURES:  Critical Care performed: No  Procedures   MEDICATIONS ORDERED IN ED: Medications - No data to display   IMPRESSION / MDM / ASSESSMENT AND PLAN / ED COURSE  I reviewed the triage vital signs and the nursing notes.                              Differential diagnosis includes, but is not limited to, possible radicular pain, musculoskeletal costal type pain, other considerations including right upper quadrant hepatobiliary or extremity given.  No nausea no vomiting no diarrhea.  No midline abdominal pain.  Very reassuring clinical exam at this time with his pain having "subsided".  No typical symptoms of ACS.  ECG and chest x-ray performed to evaluate and noted no acute finding.  Symptoms are not typical of ACS  Patient's presentation is most consistent with acute complicated illness / injury requiring diagnostic workup.  Full workup very reassuring.  Patient's symptoms I suspect may be a retrograde up to  intercostal strain or referred discomfort from the right lower chest wall.  No signs or symptoms of be suggestive of acute ACS thromboembolism pneumonia infection.  Right upper quadrant imaging reassuring very reassuring abdominal exam  Discussed with patient reports he has used Flexeril in the past with good effect, will trial this.  Discussed not driving while taking this medication he is agreeable.  Careful return precautions advised, patient and wife both agreeable with plan.   Return precautions and treatment recommendations and follow-up discussed with the patient who is agreeable with the plan.  FINAL CLINICAL IMPRESSION(S) / ED DIAGNOSES   Final diagnoses:  RUQ pain  Intercostal muscle strain, initial encounter     Rx / DC Orders   ED Discharge Orders          Ordered    cyclobenzaprine (FEXMID) 7.5 MG tablet  3 times daily PRN        04/20/23 1304             Note:  This document was prepared using Dragon voice recognition software and may include unintentional dictation errors.   Sharyn Creamer, MD 04/20/23 1311

## 2023-04-20 NOTE — ED Triage Notes (Signed)
 First Nurse Noyr;  Pt via POV from [facility]. Pt c/o epigastric pain, RUQ pain, and intermittent nausea that started 2 days ago. Report still have a gallbladder. States that had the same pain a weeks ago and it got better but got worse 2 days ago. 122/70 BP  95 HR  98% on RA

## 2023-04-20 NOTE — ED Triage Notes (Signed)
 Pt here with back pain since last week. Pt states the pain radiates across his upper abd, believes it is his gallbladder. Pt states he put a Salons patch on and it relieved the pain but came back worse today. Pt endorses nausea and constipation. Pt states he just had blood work yesterday.

## 2023-04-20 NOTE — ED Notes (Signed)
 See triage note  Presents with pain to mid/upper back and abd  Sx;s started last week  No fever

## 2023-04-20 NOTE — Discharge Instructions (Signed)
 Patient return to the ER right away if you been experiencing chest pain difficulty breathing, fever severe pain, vomiting, or other concerns or symptoms arise.  Do not drive or operate heavy machinery or be in dangerous places for at least 8 hours after taking cyclobenzaprine

## 2023-10-16 DIAGNOSIS — E782 Mixed hyperlipidemia: Secondary | ICD-10-CM | POA: Diagnosis not present

## 2023-10-16 DIAGNOSIS — R7303 Prediabetes: Secondary | ICD-10-CM | POA: Diagnosis not present

## 2023-10-26 DIAGNOSIS — E782 Mixed hyperlipidemia: Secondary | ICD-10-CM | POA: Diagnosis not present

## 2023-10-26 DIAGNOSIS — R7303 Prediabetes: Secondary | ICD-10-CM | POA: Diagnosis not present

## 2023-10-26 DIAGNOSIS — Z8042 Family history of malignant neoplasm of prostate: Secondary | ICD-10-CM | POA: Diagnosis not present

## 2023-10-26 DIAGNOSIS — I10 Essential (primary) hypertension: Secondary | ICD-10-CM | POA: Diagnosis not present

## 2023-10-26 DIAGNOSIS — G4700 Insomnia, unspecified: Secondary | ICD-10-CM | POA: Diagnosis not present

## 2023-10-26 DIAGNOSIS — F17210 Nicotine dependence, cigarettes, uncomplicated: Secondary | ICD-10-CM | POA: Diagnosis not present

## 2023-10-26 DIAGNOSIS — R0789 Other chest pain: Secondary | ICD-10-CM | POA: Diagnosis not present

## 2023-11-15 DIAGNOSIS — H66001 Acute suppurative otitis media without spontaneous rupture of ear drum, right ear: Secondary | ICD-10-CM | POA: Diagnosis not present

## 2023-11-15 DIAGNOSIS — B9689 Other specified bacterial agents as the cause of diseases classified elsewhere: Secondary | ICD-10-CM | POA: Diagnosis not present

## 2023-11-15 DIAGNOSIS — Z03818 Encounter for observation for suspected exposure to other biological agents ruled out: Secondary | ICD-10-CM | POA: Diagnosis not present

## 2023-11-15 DIAGNOSIS — J029 Acute pharyngitis, unspecified: Secondary | ICD-10-CM | POA: Diagnosis not present

## 2023-11-15 DIAGNOSIS — J209 Acute bronchitis, unspecified: Secondary | ICD-10-CM | POA: Diagnosis not present

## 2023-11-15 DIAGNOSIS — J019 Acute sinusitis, unspecified: Secondary | ICD-10-CM | POA: Diagnosis not present
